# Patient Record
Sex: Male | Born: 1960 | Race: White | Hispanic: No | Marital: Single | Smoking: Former smoker
Health system: Southern US, Community
[De-identification: ages and names within clinical notes are randomized; demographics above are authoritative.]

## PROBLEM LIST (undated history)

## (undated) DIAGNOSIS — E785 Hyperlipidemia, unspecified: Secondary | ICD-10-CM

## (undated) DIAGNOSIS — Z87798 Personal history of other (corrected) congenital malformations: Secondary | ICD-10-CM

## (undated) DIAGNOSIS — B019 Varicella without complication: Secondary | ICD-10-CM

## (undated) DIAGNOSIS — D649 Anemia, unspecified: Secondary | ICD-10-CM

## (undated) DIAGNOSIS — D563 Thalassemia minor: Secondary | ICD-10-CM

## (undated) HISTORY — DX: Anemia, unspecified: D64.9

## (undated) HISTORY — DX: Varicella without complication: B01.9

## (undated) HISTORY — DX: Thalassemia minor: D56.3

## (undated) HISTORY — DX: Personal history of other (corrected) congenital malformations: Z87.798

## (undated) HISTORY — DX: Hyperlipidemia, unspecified: E78.5

---
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
## 2012-12-18 ENCOUNTER — Ambulatory Visit (INDEPENDENT_AMBULATORY_CARE_PROVIDER_SITE_OTHER): Payer: BC Managed Care – PPO | Admitting: Physician Assistant

## 2012-12-18 ENCOUNTER — Encounter: Payer: Self-pay | Admitting: Physician Assistant

## 2012-12-18 VITALS — BP 118/82 | HR 58 | Temp 98.5°F | Resp 16 | Ht 70.0 in | Wt 181.8 lb

## 2012-12-18 DIAGNOSIS — Z862 Personal history of diseases of the blood and blood-forming organs and certain disorders involving the immune mechanism: Secondary | ICD-10-CM

## 2012-12-18 DIAGNOSIS — Z8739 Personal history of other diseases of the musculoskeletal system and connective tissue: Secondary | ICD-10-CM | POA: Insufficient documentation

## 2012-12-18 DIAGNOSIS — R5383 Other fatigue: Secondary | ICD-10-CM | POA: Insufficient documentation

## 2012-12-18 DIAGNOSIS — D563 Thalassemia minor: Secondary | ICD-10-CM | POA: Insufficient documentation

## 2012-12-18 DIAGNOSIS — R5381 Other malaise: Secondary | ICD-10-CM

## 2012-12-18 DIAGNOSIS — Z0000 Encounter for general adult medical examination without abnormal findings: Secondary | ICD-10-CM

## 2012-12-18 LAB — HEMOGLOBIN A1C
Hgb A1c MFr Bld: 5.3 % (ref <5.7)
Mean Plasma Glucose: 105 mg/dL (ref <117)

## 2012-12-18 LAB — PSA: PSA: 0.83 ng/mL (ref <=4.00)

## 2012-12-18 LAB — HEPATIC FUNCTION PANEL
ALT: 29 U/L (ref 0–53)
AST: 23 U/L (ref 0–37)
Alkaline Phosphatase: 43 U/L (ref 39–117)
Bilirubin, Direct: 0.5 mg/dL — ABNORMAL HIGH (ref 0.0–0.3)
Indirect Bilirubin: 2.2 mg/dL — ABNORMAL HIGH (ref 0.0–0.9)
Total Protein: 7.4 g/dL (ref 6.0–8.3)

## 2012-12-18 LAB — CBC WITH DIFFERENTIAL/PLATELET
Eosinophils Relative: 1 % (ref 0–5)
Lymphocytes Relative: 33 % (ref 12–46)
Lymphs Abs: 1.3 10*3/uL (ref 0.7–4.0)
MCV: 56.7 fL — ABNORMAL LOW (ref 78.0–100.0)
Neutro Abs: 2.3 10*3/uL (ref 1.7–7.7)
Platelets: 172 10*3/uL (ref 150–400)
RBC: 6.53 MIL/uL — ABNORMAL HIGH (ref 4.22–5.81)
WBC: 4 10*3/uL (ref 4.0–10.5)

## 2012-12-18 LAB — LIPID PANEL
LDL Cholesterol: 145 mg/dL — ABNORMAL HIGH (ref 0–99)
VLDL: 13 mg/dL (ref 0–40)

## 2012-12-18 LAB — BASIC METABOLIC PANEL
CO2: 27 mEq/L (ref 19–32)
Chloride: 104 mEq/L (ref 96–112)
Creat: 1.02 mg/dL (ref 0.50–1.35)
Potassium: 4.4 mEq/L (ref 3.5–5.3)
Sodium: 139 mEq/L (ref 135–145)

## 2012-12-18 LAB — TSH: TSH: 0.842 u[IU]/mL (ref 0.350–4.500)

## 2012-12-18 NOTE — Assessment & Plan Note (Signed)
Will check labs today

## 2012-12-18 NOTE — Assessment & Plan Note (Signed)
Will check CBC, TSH, Testosterone.  Patient to attempt 7-8 hours of sleep each night.  Daily exercise as tolerated.  Improve current diet.

## 2012-12-18 NOTE — Assessment & Plan Note (Signed)
Patient encouraged to have annual eye examination, and dental checks 2 x year.  Will obtain fasting labs at today's visit.  Gets annual flu shot through work

## 2012-12-18 NOTE — Assessment & Plan Note (Signed)
Asymptomatic.  Continue monthly care with Chiropractor

## 2012-12-18 NOTE — Patient Instructions (Signed)
Please obtain fasting labs.  I will call you with the results.  We will treat you accordingly to lab results.  Return in 1 year for annual exam or sooner if needed.  Fatigue Fatigue is a feeling of tiredness, lack of energy, lack of motivation, or feeling tired all the time. Having enough rest, good nutrition, and reducing stress will normally reduce fatigue. Consult your caregiver if it persists. The nature of your fatigue will help your caregiver to find out its cause. The treatment is based on the cause.  CAUSES  There are many causes for fatigue. Most of the time, fatigue can be traced to one or more of your habits or routines. Most causes fit into one or more of three general areas. They are: Lifestyle problems  Sleep disturbances.  Overwork.  Physical exertion.  Unhealthy habits.  Poor eating habits or eating disorders.  Alcohol and/or drug use .  Lack of proper nutrition (malnutrition). Psychological problems  Stress and/or anxiety problems.  Depression.  Grief.  Boredom. Medical Problems or Conditions  Anemia.  Pregnancy.  Thyroid gland problems.  Recovery from major surgery.  Continuous pain.  Emphysema or asthma that is not well controlled  Allergic conditions.  Diabetes.  Infections (such as mononucleosis).  Obesity.  Sleep disorders, such as sleep apnea.  Heart failure or other heart-related problems.  Cancer.  Kidney disease.  Liver disease.  Effects of certain medicines such as antihistamines, cough and cold remedies, prescription pain medicines, heart and blood pressure medicines, drugs used for treatment of cancer, and some antidepressants. SYMPTOMS  The symptoms of fatigue include:   Lack of energy.  Lack of drive (motivation).  Drowsiness.  Feeling of indifference to the surroundings. DIAGNOSIS  The details of how you feel help guide your caregiver in finding out what is causing the fatigue. You will be asked about your  present and past health condition. It is important to review all medicines that you take, including prescription and non-prescription items. A thorough exam will be done. You will be questioned about your feelings, habits, and normal lifestyle. Your caregiver may suggest blood tests, urine tests, or other tests to look for common medical causes of fatigue.  TREATMENT  Fatigue is treated by correcting the underlying cause. For example, if you have continuous pain or depression, treating these causes will improve how you feel. Similarly, adjusting the dose of certain medicines will help in reducing fatigue.  HOME CARE INSTRUCTIONS   Try to get the required amount of good sleep every night.  Eat a healthy and nutritious diet, and drink enough water throughout the day.  Practice ways of relaxing (including yoga or meditation).  Exercise regularly.  Make plans to change situations that cause stress. Act on those plans so that stresses decrease over time. Keep your work and personal routine reasonable.  Avoid street drugs and minimize use of alcohol.  Start taking a daily multivitamin after consulting your caregiver. SEEK MEDICAL CARE IF:   You have persistent tiredness, which cannot be accounted for.  You have fever.  You have unintentional weight loss.  You have headaches.  You have disturbed sleep throughout the night.  You are feeling sad.  You have constipation.  You have dry skin.  You have gained weight.  You are taking any new or different medicines that you suspect are causing fatigue.  You are unable to sleep at night.  You develop any unusual swelling of your legs or other parts of your body. SEEK  IMMEDIATE MEDICAL CARE IF:   You are feeling confused.  Your vision is blurred.  You feel faint or pass out.  You develop severe headache.  You develop severe abdominal, pelvic, or back pain.  You develop chest pain, shortness of breath, or an irregular or fast  heartbeat.  You are unable to pass a normal amount of urine.  You develop abnormal bleeding such as bleeding from the rectum or you vomit blood.  You have thoughts about harming yourself or committing suicide.  You are worried that you might harm someone else. MAKE SURE YOU:   Understand these instructions.  Will watch your condition.  Will get help right away if you are not doing well or get worse. Document Released: 02/04/2007 Document Revised: 07/02/2011 Document Reviewed: 02/04/2007 [facility] Patient Information 2014 [place], [place].

## 2012-12-18 NOTE — Progress Notes (Signed)
Patient ID: Howard Hicks, male   DOB: [birth year], 52 y.o.   MRN: [id number]  Patient presents to clinic today to establish care.  Information was obtained from the patient.   Acute Concerns: Patient presents c/o fatigue for the past 3 weeks.  Patient states he just does not have his usual energy.  Denies recent changes at work or home.  Denies history of anxiety or depression.  Endorses pound weight loss over the past year.  Denies history of low testosterone or thyroid disorder.  Has history of anemia but has never been on medication for this.  Denies HTN, HLD, SOB, recent infection.  Denies heart palpitations.  Denies C/D.  Denies heat/cold insensitivity.  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  Patient has rare back pain.  Sees chiropractor monthly.  Health Maintenance: Eyes -- last visit over 1 year ago. Dental -- yearly cleanings Immunizations -- up-to-date.  Gets immunizations through work. PSA -- yearly since 51 with no abnormal values.  Asymptomatic.  Past Medical History  Diagnosis Date  . Anemia   . Chicken pox    No current outpatient prescriptions on file prior to visit.   No current facility-administered medications on file prior to visit.    No Known Allergies  Family History  Problem Relation Age of Onset  . Hyperlipidemia Father   . Dementia Father   . Anemia Mother   . Dementia Paternal Grandmother   . Diabetes Maternal Grandmother   . Crohn's disease Paternal Uncle   . Anemia Brother   . Healthy Sister     x2    History   Social History  . Marital Status: Divorced    Spouse Name: N/A    Number of Children: N/A  . Years of Education: N/A   Social History Main Topics  . Smoking status: Current Some Day Smoker    Types: Cigars  . Smokeless tobacco: None     Comment: Cigars Only Socially.  . Alcohol Use: None  . Drug Use: None  . Sexual Activity: None   Other Topics Concern  . None   Social History Narrative  .  None   Review of Systems  Constitutional: Positive for malaise/fatigue. Negative for fever and chills.  HENT: Negative for hearing loss, ear pain, tinnitus and ear discharge.   Eyes: Negative for blurred vision, double vision, photophobia and pain.  Respiratory: Negative for cough, shortness of breath and wheezing.   Cardiovascular: Negative for chest pain and palpitations.  Gastrointestinal: Negative for heartburn, nausea, vomiting, abdominal pain, diarrhea, constipation, blood in stool and melena.  Genitourinary: Negative for dysuria, urgency, frequency, hematuria and flank pain.  Musculoskeletal: Positive for back pain. Negative for myalgias.  Skin: Negative for rash.  Neurological: Negative for dizziness, seizures, loss of consciousness and headaches.  Endo/Heme/Allergies: Negative for environmental allergies. Does not bruise/bleed easily.  Psychiatric/Behavioral: Negative for depression, suicidal ideas and substance abuse. The patient is not nervous/anxious and does not have insomnia.    Filed Vitals:   12/18/12 0835  BP: 118/82  Pulse: 58  Temp: 98.5 F (36.9 C)  Resp: 16   Physical Exam  Constitutional: He is oriented to person, place, and time and well-developed, well-nourished, and in no distress.  HENT:  Head: Normocephalic and atraumatic.  Right Ear: External ear normal.  Left Ear: External ear normal.  Nose: Nose normal.  Mouth/Throat: Oropharynx is clear and moist. No oropharyngeal exudate.  TM WNL Bilaterally  Eyes: Conjunctivae and EOM are  normal. Pupils are equal, round, and reactive to light.  Neck: Normal range of motion. Neck supple. No thyromegaly present.  Cardiovascular: Normal rate, regular rhythm, normal heart sounds and intact distal pulses.   Pulmonary/Chest: Effort normal and breath sounds normal.  Abdominal: Soft. Bowel sounds are normal. He exhibits no distension and no mass. There is no tenderness. There is no rebound and no guarding.   Musculoskeletal: Normal range of motion. He exhibits no tenderness.  Lymphadenopathy:    He has no cervical adenopathy.  Neurological: He is alert and oriented to person, place, and time. He has normal reflexes. No cranial nerve deficit.  Skin: Skin is warm and dry. No rash noted.   Assessment/Plan: No problem-specific assessment & plan notes found for this encounter.

## 2012-12-19 LAB — URINALYSIS, ROUTINE W REFLEX MICROSCOPIC
Bilirubin Urine: NEGATIVE
Hgb urine dipstick: NEGATIVE
Ketones, ur: NEGATIVE mg/dL
Nitrite: NEGATIVE
Urobilinogen, UA: 0.2 mg/dL (ref 0.0–1.0)

## 2012-12-30 ENCOUNTER — Telehealth: Payer: Self-pay | Admitting: Physician Assistant

## 2012-12-30 NOTE — Telephone Encounter (Signed)
Received medical records from [facility]

## 2012-12-31 ENCOUNTER — Telehealth: Payer: Self-pay | Admitting: *Deleted

## 2012-12-31 NOTE — Telephone Encounter (Signed)
Message    Please inform patient that his bilirubin is slightly elevated which could be due to his thalassemia (from red blood cell breakdown). I would like for him to increase his fluid intake and return in 2 weeks for a follow-up and repeat liver function testing.    Patient scheduled for f/u OV on Tuesday, 09.16.14 at 8:15a/SLS

## 2013-01-06 ENCOUNTER — Encounter: Payer: Self-pay | Admitting: Physician Assistant

## 2013-01-06 ENCOUNTER — Ambulatory Visit (INDEPENDENT_AMBULATORY_CARE_PROVIDER_SITE_OTHER): Payer: BC Managed Care – PPO | Admitting: Physician Assistant

## 2013-01-06 VITALS — BP 118/64 | HR 51 | Temp 97.6°F | Resp 10 | Wt 186.0 lb

## 2013-01-06 DIAGNOSIS — R17 Unspecified jaundice: Secondary | ICD-10-CM | POA: Insufficient documentation

## 2013-01-06 DIAGNOSIS — D563 Thalassemia minor: Secondary | ICD-10-CM

## 2013-01-06 DIAGNOSIS — R6889 Other general symptoms and signs: Secondary | ICD-10-CM

## 2013-01-06 DIAGNOSIS — R899 Unspecified abnormal finding in specimens from other organs, systems and tissues: Secondary | ICD-10-CM

## 2013-01-06 LAB — CBC WITH DIFFERENTIAL/PLATELET
Hemoglobin: 12 g/dL — ABNORMAL LOW (ref 13.0–17.0)
Lymphocytes Relative: 33 % (ref 12–46)
Lymphs Abs: 1.3 10*3/uL (ref 0.7–4.0)
MCH: 18.6 pg — ABNORMAL LOW (ref 26.0–34.0)
Monocytes Relative: 9 % (ref 3–12)
Neutrophils Relative %: 54 % (ref 43–77)
Platelets: 193 10*3/uL (ref 150–400)
RBC: 6.44 MIL/uL — ABNORMAL HIGH (ref 4.22–5.81)
WBC: 3.9 10*3/uL — ABNORMAL LOW (ref 4.0–10.5)

## 2013-01-06 LAB — COMPREHENSIVE METABOLIC PANEL
Albumin: 4.3 g/dL (ref 3.5–5.2)
Alkaline Phosphatase: 40 U/L (ref 39–117)
BUN: 12 mg/dL (ref 6–23)
CO2: 28 mEq/L (ref 19–32)
Glucose, Bld: 90 mg/dL (ref 70–99)
Sodium: 141 mEq/L (ref 135–145)
Total Bilirubin: 1.8 mg/dL — ABNORMAL HIGH (ref 0.3–1.2)
Total Protein: 6.9 g/dL (ref 6.0–8.3)

## 2013-01-06 NOTE — Progress Notes (Signed)
Patient ID: Howard Hicks, male   DOB: Jun 14, 1960, 52 y.o.   MRN: [id number]  Patient presents to clinic today for 2 week follow-up of elevated bilirubin.  Patient has history of thalassemia for which he has seen hematologist in the past.  Patient states his anemia required no treatment according to the hematologist.  Was able to track down records this morning to verify.  Patient has diagnosis of alpha thalassemia trait.  Patient states that since last visit he has felt much better and is back to normal in terms of energy levels.  Patient has no other complaints today.  Past Medical History  Diagnosis Date  . Anemia   . Chicken pox     No current outpatient prescriptions on file prior to visit.   No current facility-administered medications on file prior to visit.    No Known Allergies  Family History  Problem Relation Age of Onset  . Hyperlipidemia Father   . Dementia Father   . Anemia Mother   . Dementia Paternal Grandmother   . Diabetes Maternal Grandmother   . Crohn's disease Paternal Uncle   . Anemia Brother   . Healthy Sister     x2    History   Social History  . Marital Status: Divorced    Spouse Name: N/A    Number of Children: N/A  . Years of Education: N/A   Social History Main Topics  . Smoking status: Former Smoker    Types: Cigars  . Smokeless tobacco: Never Used     Comment: Cigars Only Socially.  . Alcohol Use: Yes     Comment: rare  . Drug Use: No  . Sexual Activity: Yes    Birth Control/ Protection: None   Other Topics Concern  . None   Social History Narrative  . None   Review of Systems  Constitutional: Negative for fever, chills and malaise/fatigue.  Respiratory: Negative for shortness of breath.   Cardiovascular: Negative for chest pain and palpitations.  Neurological: Negative for headaches.   Filed Vitals:   01/06/13 0814  BP: 118/64  Pulse: 51  Temp: 97.6 F (36.4 C)  Resp: 10   Physical Exam  Vitals  reviewed. Constitutional: He is oriented to person, place, and time and well-developed, well-nourished, and in no distress.  HENT:  Head: Normocephalic and atraumatic.  Mouth/Throat: Oropharynx is clear and moist.  Eyes: Conjunctivae and EOM are normal.  Cardiovascular: Normal rate, regular rhythm, normal heart sounds and intact distal pulses.   Good capillary refill  Pulmonary/Chest: Effort normal and breath sounds normal.  Neurological: He is alert and oriented to person, place, and time.  Skin: Skin is warm and dry.   Recent Results (from the past 2160 hour(s))  CBC WITH DIFFERENTIAL     Status: Abnormal   Collection Time    12/18/12  9:49 AM      Result Value Range   WBC 4.0  4.0 - 10.5 K/uL   RBC 6.53 (*) 4.22 - 5.81 MIL/uL   Hemoglobin 12.5 (*) 13.0 - 17.0 g/dL   HCT 40.9 (*) 81.1 - 91.4 %   MCV 56.7 (*) 78.0 - 100.0 fL   MCH 19.1 (*) 26.0 - 34.0 pg   MCHC 33.8  30.0 - 36.0 g/dL   RDW 78.2 (*) 95.6 - 21.3 %   Platelets 172  150 - 400 K/uL   Neutrophils Relative % 58  43 - 77 %   Neutro Abs 2.3  1.7 - 7.7 K/uL  Lymphocytes Relative 33  12 - 46 %   Lymphs Abs 1.3  0.7 - 4.0 K/uL   Monocytes Relative 7  3 - 12 %   Monocytes Absolute 0.3  0.1 - 1.0 K/uL   Eosinophils Relative 1  0 - 5 %   Eosinophils Absolute 0.0  0.0 - 0.7 K/uL   Basophils Relative 1  0 - 1 %   Basophils Absolute 0.0  0.0 - 0.1 K/uL   Smear Review Criteria for review not met    BASIC METABOLIC PANEL     Status: None   Collection Time    12/18/12  9:49 AM      Result Value Range   Sodium 139  135 - 145 mEq/L   Potassium 4.4  3.5 - 5.3 mEq/L   Chloride 104  96 - 112 mEq/L   CO2 27  19 - 32 mEq/L   Glucose, Bld 90  70 - 99 mg/dL   BUN 13  6 - 23 mg/dL   Creat 2.95  2.84 - 1.32 mg/dL   Calcium 9.8  8.4 - 44.0 mg/dL  HEPATIC FUNCTION PANEL     Status: Abnormal   Collection Time    12/18/12  9:49 AM      Result Value Range   Total Bilirubin 2.7 (*) 0.3 - 1.2 mg/dL   Bilirubin, Direct 0.5 (*) 0.0 -  0.3 mg/dL   Indirect Bilirubin 2.2 (*) 0.0 - 0.9 mg/dL   Alkaline Phosphatase 43  39 - 117 U/L   AST 23  0 - 37 U/L   ALT 29  0 - 53 U/L   Total Protein 7.4  6.0 - 8.3 g/dL   Albumin 4.6  3.5 - 5.2 g/dL  LIPID PANEL     Status: Abnormal   Collection Time    12/18/12  9:49 AM      Result Value Range   Cholesterol 210 (*) 0 - 200 mg/dL   Comment: ATP III Classification:           < 200        mg/dL        Desirable          200 - 239     mg/dL        Borderline High          >= 240        mg/dL        High         Triglycerides 67  <150 mg/dL   HDL 52  >10 mg/dL   Total CHOL/HDL Ratio 4.0     VLDL 13  0 - 40 mg/dL   LDL Cholesterol 272 (*) 0 - 99 mg/dL   Comment:       Total Cholesterol/HDL Ratio:CHD Risk                            Coronary Heart Disease Risk Table                                            Men       Women              1/2 Average Risk              3.4  3.3                  Average Risk              5.0        4.4               2X Average Risk              9.6        7.1               3X Average Risk             23.4       11.0     Use the calculated Patient Ratio above and the CHD Risk table      to determine the patient's CHD Risk.     ATP III Classification (LDL):           < 100        mg/dL         Optimal          100 - 129     mg/dL         Near or Above Optimal          130 - 159     mg/dL         Borderline High          160 - 189     mg/dL         High           > 190        mg/dL         Very High        TSH     Status: None   Collection Time    12/18/12  9:49 AM      Result Value Range   TSH 0.842  0.350 - 4.500 uIU/mL  PSA     Status: None   Collection Time    12/18/12  9:49 AM      Result Value Range   PSA 0.83  <=4.00 ng/mL   Comment: Test Methodology: ECLIA PSA (Electrochemiluminescence Immunoassay)           For PSA values from 2.5-4.0, particularly in younger men <60 years     old, the AUA and NCCN suggest testing for % Free  PSA (3515) and     evaluation of the rate of increase in PSA (PSA velocity).  HEMOGLOBIN A1C     Status: None   Collection Time    12/18/12  9:49 AM      Result Value Range   Hemoglobin A1C 5.3  <5.7 %   Comment:                                                                            According to the ADA Clinical Practice Recommendations for 2011, when     HbA1c is used as a screening test:             >=6.5%   Diagnostic of Diabetes Mellitus                (  if abnormal result is confirmed)           5.7-6.4%   Increased risk of developing Diabetes Mellitus           References:Diagnosis and Classification of Diabetes Mellitus,Diabetes     Care,2011,34(Suppl 1):S62-S69 and Standards of Medical Care in             Diabetes - 2011,Diabetes Care,2011,34 (Suppl 1):S11-S61.         Mean Plasma Glucose 105  <117 mg/dL  URINALYSIS, ROUTINE W REFLEX MICROSCOPIC     Status: None   Collection Time    12/18/12  9:49 AM      Result Value Range   Color, Urine YELLOW  YELLOW   APPearance CLEAR  CLEAR   Specific Gravity, Urine 1.019  1.005 - 1.030   pH 6.0  5.0 - 8.0   Glucose, UA NEG  NEG mg/dL   Bilirubin Urine NEG  NEG   Ketones, ur NEG  NEG mg/dL   Hgb urine dipstick NEG  NEG   Protein, ur NEG  NEG mg/dL   Urobilinogen, UA 0.2  0.0 - 1.0 mg/dL   Nitrite NEG  NEG   Leukocytes, UA NEG  NEG  TESTOSTERONE     Status: None   Collection Time    12/18/12  9:49 AM      Result Value Range   Testosterone 698  300 - 890 ng/dL   Comment:           Tanner Stage       Male              Male                   I              < 30 ng/dL        < 10 ng/dL                   II             < 150 ng/dL       < 30 ng/dL                   III            100-320 ng/dL     < 35 ng/dL                   IV             200-970 ng/dL     16-10 ng/dL                   V/Adult        300-890 ng/dL     96-04 ng/dL          Assessment/Plan: Alpha thalassemia trait Repeat CMET. Records reviewed with  patient.  Patient asymptomatic.  Elevated bilirubin Repeat CMET.  Patient's record reviewed, showing transient elevations of bilirubin in the past coinciding with his thalassemia.

## 2013-01-06 NOTE — Patient Instructions (Addendum)
Please obtain labs today.  I will call you with your results.  Follow-up in 6 months for repeat cholesterol check.  Please call or return sooner if you need anything.

## 2013-01-06 NOTE — Assessment & Plan Note (Signed)
Repeat CMET.  Patient's record reviewed, showing transient elevations of bilirubin in the past coinciding with his thalassemia.

## 2013-01-06 NOTE — Assessment & Plan Note (Addendum)
Repeat CMET. Records reviewed with patient.  Patient asymptomatic.

## 2013-06-03 ENCOUNTER — Encounter: Payer: Self-pay | Admitting: *Deleted

## 2013-06-29 ENCOUNTER — Encounter: Payer: Self-pay | Admitting: Physician Assistant

## 2013-06-29 ENCOUNTER — Ambulatory Visit (INDEPENDENT_AMBULATORY_CARE_PROVIDER_SITE_OTHER): Payer: BC Managed Care – PPO | Admitting: Physician Assistant

## 2013-06-29 ENCOUNTER — Ambulatory Visit (HOSPITAL_BASED_OUTPATIENT_CLINIC_OR_DEPARTMENT_OTHER)
Admission: RE | Admit: 2013-06-29 | Discharge: 2013-06-29 | Disposition: A | Discharge status: Home / Self Care | Payer: BC Managed Care – PPO | Source: Ambulatory Visit | Attending: Physician Assistant | Admitting: Physician Assistant

## 2013-06-29 VITALS — BP 136/83 | HR 66 | Temp 98.2°F | Resp 16 | Ht 70.0 in | Wt 181.4 lb

## 2013-06-29 DIAGNOSIS — R1013 Epigastric pain: Secondary | ICD-10-CM | POA: Insufficient documentation

## 2013-06-29 LAB — COMPREHENSIVE METABOLIC PANEL
ALT: 35 U/L (ref 0–53)
AST: 25 U/L (ref 0–37)
Albumin: 4 g/dL (ref 3.5–5.2)
Alkaline Phosphatase: 52 U/L (ref 39–117)
BUN: 13 mg/dL (ref 6–23)
CALCIUM: 9.1 mg/dL (ref 8.4–10.5)
CHLORIDE: 106 meq/L (ref 96–112)
CO2: 29 mEq/L (ref 19–32)
CREATININE: 0.9 mg/dL (ref 0.4–1.5)
GFR: 88.95 mL/min (ref >60.00)
GLUCOSE: 95 mg/dL (ref 70–99)
Potassium: 3.7 mEq/L (ref 3.5–5.1)
Sodium: 141 mEq/L (ref 135–145)
Total Bilirubin: 2 mg/dL — ABNORMAL HIGH (ref 0.3–1.2)
Total Protein: 6.9 g/dL (ref 6.0–8.3)

## 2013-06-29 LAB — CBC WITH DIFFERENTIAL/PLATELET
Basophils Absolute: 0 10*3/uL (ref 0.0–0.1)
Basophils Relative: 0.4 % (ref 0.0–3.0)
EOS ABS: 1.5 10*3/uL — AB (ref 0.0–0.7)
Eosinophils Relative: 18.3 % — ABNORMAL HIGH (ref 0.0–5.0)
HCT: 38.9 % — ABNORMAL LOW (ref 39.0–52.0)
Hemoglobin: 12 g/dL — ABNORMAL LOW (ref 13.0–17.0)
LYMPHS PCT: 21.4 % (ref 12.0–46.0)
Lymphs Abs: 1.7 10*3/uL (ref 0.7–4.0)
MCHC: 30.9 g/dL (ref 30.0–36.0)
MCV: 61.4 fl — ABNORMAL LOW (ref 78.0–100.0)
Monocytes Absolute: 0.4 10*3/uL (ref 0.1–1.0)
Monocytes Relative: 5.3 % (ref 3.0–12.0)
NEUTROS ABS: 4.4 10*3/uL (ref 1.4–7.7)
Neutrophils Relative %: 54.6 % (ref 43.0–77.0)
Platelets: 171 10*3/uL (ref 150.0–400.0)
RBC: 6.33 Mil/uL — ABNORMAL HIGH (ref 4.22–5.81)
RDW: 18.7 % — ABNORMAL HIGH (ref 11.5–14.6)
WBC: 8 10*3/uL (ref 4.5–10.5)

## 2013-06-29 LAB — H. PYLORI ANTIBODY, IGG: H Pylori IgG: NEGATIVE

## 2013-06-29 MED ORDER — TRAMADOL HCL 50 MG PO TABS: 50.0000 mg | ORAL_TABLET | Freq: Three times a day (TID) | ORAL | Status: DC | PRN

## 2013-06-29 MED ORDER — DEXLANSOPRAZOLE 60 MG PO CPDR: 60.0000 mg | DELAYED_RELEASE_CAPSULE | Freq: Every day | ORAL | Status: DC

## 2013-06-29 NOTE — Assessment & Plan Note (Signed)
DDx: PUD, H. Pylori infection, Mild pancreatitis. CBC, CMP, H. Pylori IgG, Abdominal US.  Rx Tramadol for severe pain.  Samples of Dexilant given to patient to take daily.  Use Pepto Bismol as needed.  Elevate HOB.  If workup unremarkable, will need referral to GI.  Patient instructed to proceed directly to the ER if symptoms acutely worsen. Patient voices understanding.

## 2013-06-29 NOTE — Patient Instructions (Addendum)
Please obtain labs.  I will call you with your results.  I will also call you with your imaging results.  Please avoid late-night eating and spicy foods.  Please take Dexilant daily.  Use Pepto-Bismol as needed.  Tylenol for pain. Tramadol for severe pain. Avoid Ibuprofen, Aleve, BC powders.  No alcohol consumption.  If symptoms acutely worsen before workup can be completed, please proceed to the ER.

## 2013-06-29 NOTE — Progress Notes (Signed)
Patient presents to clinic today c/o constant dull epigastric pain x 1 week. Pain is worse after eating.  Patient endorses intermittent sharp pains.  Patient endorses some mild nausea and increased belching.  Patient denies fever, chills, emesis.  Denies change in bowel habits, melena or hematochezia.  Denies recent alcohol consumption, NSAID use.  Denies hx of gallstone.  Vital signs reviewed and found to be within normal limits.  Past Medical History  Diagnosis Date  . Anemia   . Chicken pox   . Alpha thalassemia trait   . Hyperlipidemia   . H/O removal of thyroglossal duct cyst     No current outpatient prescriptions on file prior to visit.   No current facility-administered medications on file prior to visit.    No Known Allergies  Family History  Problem Relation Age of Onset  . Hyperlipidemia Father   . Dementia Father   . Anemia Mother   . Dementia Paternal Grandmother   . Diabetes Maternal Grandmother   . Crohn's disease Paternal Uncle   . Anemia Brother   . Healthy Sister     x2  . Colon cancer Neg Hx   . Osteoporosis Mother   . Thyroid disease Father   . Colon polyps Father     History   Social History  . Marital Status: Divorced    Spouse Name: N/A    Number of Children: N/A  . Years of Education: N/A   Social History Main Topics  . Smoking status: Former Smoker    Types: Cigars  . Smokeless tobacco: Never Used     Comment: Cigars Only Socially.  . Alcohol Use: Yes     Comment: rare  . Drug Use: No  . Sexual Activity: Yes    Birth Control/ Protection: None   Other Topics Concern  . None   Social History Narrative   Pt Divorced, 2 children, 1 grandchild   Occupation: Museum/gallery conservatorvideographer & cameraman   Employer: [employer]   Hobbies: rock climbing         Review of Systems - See HPI.  All other ROS are negative.  BP 136/83  Pulse 66  Temp(Src) 98.2 F (36.8 C) (Oral)  Resp 16  Ht 5\' 10"  (1.778 m)  Wt 181 lb 6 oz (82.271 kg)  BMI 26.02 kg/m2   SpO2 100%  Physical Exam  Vitals reviewed. Constitutional: He is oriented to person, place, and time and well-developed, well-nourished, and in no distress.  HENT:  Head: Normocephalic and atraumatic.  Eyes: Conjunctivae are normal. Pupils are equal, round, and reactive to light.  Neck: Neck supple.  Cardiovascular: Normal rate, regular rhythm, normal heart sounds and intact distal pulses.   Pulmonary/Chest: Effort normal and breath sounds normal.  Abdominal: Soft. Bowel sounds are normal. He exhibits no distension and no mass. There is no hepatosplenomegaly. There is tenderness in the right upper quadrant and epigastric area. There is no rebound, no guarding and negative Murphy's sign.  Neurological: He is alert and oriented to person, place, and time.  Skin: Skin is warm and dry. No rash noted.  Psychiatric: Affect normal.   Assessment/Plan: Abdominal pain, epigastric DDx: PUD, H. Pylori infection, Mild pancreatitis. CBC, CMP, H. Pylori IgG, Abdominal US.  Rx Tramadol for severe pain.  Samples of Dexilant given to patient to take daily.  Use Pepto Bismol as needed.  Elevate HOB.  If workup unremarkable, will need referral to GI.  Patient instructed to proceed directly to the ER if  symptoms acutely worsen. Patient voices understanding.

## 2013-06-29 NOTE — Progress Notes (Signed)
Pre visit review using our clinic review tool, if applicable. No additional management support is needed unless otherwise documented below in the visit note/SLS  

## 2013-07-17 ENCOUNTER — Ambulatory Visit: Payer: BC Managed Care – PPO | Admitting: Physician Assistant

## 2013-08-07 ENCOUNTER — Encounter: Payer: Self-pay | Admitting: Physician Assistant

## 2014-06-15 ENCOUNTER — Encounter: Payer: Self-pay | Admitting: *Deleted

## 2014-06-15 ENCOUNTER — Telehealth: Payer: Self-pay | Admitting: *Deleted

## 2014-06-15 NOTE — Telephone Encounter (Signed)
Pre-Visit Call:   Reviewed allergies, medications, health history, and health maintenance with patient and made changes as appropriate.    Patient states that he takes no medications (Left on Medication list, marked Not Taking) Preferred pharmacy:  [id number] - [place],  - [address]   Health Maintenance:  PSA- 12/18/12 with Malva Coganody Martin, PA- normal CCS- 12/18/12 with Danny LawlessJohn Baillie, MD at [facility]- normal HIV screening- unknown, patient states "it's been a while"  Immunizations: UTD Flu-02/04/14 Td- 05/09/07  Concerns: Patient has history of Thalassemia, states he periodically feels fatigued and this episode of fatigue is "lingering"

## 2014-06-16 ENCOUNTER — Encounter: Payer: Self-pay | Admitting: Physician Assistant

## 2014-06-16 ENCOUNTER — Ambulatory Visit (INDEPENDENT_AMBULATORY_CARE_PROVIDER_SITE_OTHER): Payer: BLUE CROSS/BLUE SHIELD | Admitting: Physician Assistant

## 2014-06-16 VITALS — BP 116/80 | HR 53 | Temp 97.9°F | Resp 16 | Ht 70.0 in | Wt 187.4 lb

## 2014-06-16 DIAGNOSIS — Z0000 Encounter for general adult medical examination without abnormal findings: Secondary | ICD-10-CM

## 2014-06-16 DIAGNOSIS — R5382 Chronic fatigue, unspecified: Secondary | ICD-10-CM

## 2014-06-16 LAB — CBC
HCT: 38.4 % — ABNORMAL LOW (ref 39.0–52.0)
Hemoglobin: 12.2 g/dL — ABNORMAL LOW (ref 13.0–17.0)
MCHC: 31.7 g/dL (ref 30.0–36.0)
MCV: 60.4 fl — ABNORMAL LOW (ref 78.0–100.0)
Platelets: 249 10*3/uL (ref 150.0–400.0)
RBC: 6.36 Mil/uL — ABNORMAL HIGH (ref 4.22–5.81)
RDW: 17.8 % — AB (ref 11.5–15.5)
WBC: 3.7 10*3/uL — ABNORMAL LOW (ref 4.0–10.5)

## 2014-06-16 LAB — URINALYSIS, ROUTINE W REFLEX MICROSCOPIC
Bilirubin Urine: NEGATIVE
HGB URINE DIPSTICK: NEGATIVE
Ketones, ur: NEGATIVE
Leukocytes, UA: NEGATIVE
Nitrite: NEGATIVE
RBC / HPF: NONE SEEN (ref 0–?)
Specific Gravity, Urine: <=1.005 — AB (ref 1.000–1.030)
Total Protein, Urine: NEGATIVE
URINE GLUCOSE: NEGATIVE
Urobilinogen, UA: 0.2 (ref 0.0–1.0)
WBC, UA: NONE SEEN (ref 0–?)
pH: 6 (ref 5.0–8.0)

## 2014-06-16 LAB — LIPID PANEL
CHOLESTEROL: 192 mg/dL (ref 0–200)
HDL: 49.2 mg/dL (ref >39.00)
LDL Cholesterol: 130 mg/dL — ABNORMAL HIGH (ref 0–99)
NONHDL: 142.8
Total CHOL/HDL Ratio: 4
Triglycerides: 63 mg/dL (ref 0.0–149.0)
VLDL: 12.6 mg/dL (ref 0.0–40.0)

## 2014-06-16 LAB — BASIC METABOLIC PANEL
BUN: 16 mg/dL (ref 6–23)
CO2: 30 meq/L (ref 19–32)
CREATININE: 0.92 mg/dL (ref 0.40–1.50)
Calcium: 9.5 mg/dL (ref 8.4–10.5)
Chloride: 106 mEq/L (ref 96–112)
GFR: 90.85 mL/min (ref >60.00)
Glucose, Bld: 91 mg/dL (ref 70–99)
Potassium: 4.1 mEq/L (ref 3.5–5.1)
Sodium: 139 mEq/L (ref 135–145)

## 2014-06-16 LAB — HEPATIC FUNCTION PANEL
ALK PHOS: 48 U/L (ref 39–117)
ALT: 24 U/L (ref 0–53)
AST: 19 U/L (ref 0–37)
Albumin: 4.3 g/dL (ref 3.5–5.2)
Bilirubin, Direct: 0.3 mg/dL (ref 0.0–0.3)
TOTAL PROTEIN: 7.1 g/dL (ref 6.0–8.3)
Total Bilirubin: 1.6 mg/dL — ABNORMAL HIGH (ref 0.2–1.2)

## 2014-06-16 LAB — HIGH SENSITIVITY CRP: CRP HIGH SENSITIVITY: 0.94 mg/L (ref 0.000–5.000)

## 2014-06-16 LAB — HEMOGLOBIN A1C: HEMOGLOBIN A1C: 5.5 % (ref 4.6–6.5)

## 2014-06-16 LAB — TSH: TSH: 0.7 u[IU]/mL (ref 0.35–4.50)

## 2014-06-16 NOTE — Assessment & Plan Note (Signed)
I have reviewed the patient's medical history in detail and updated the computerized patient record.  PHQ-2 Depression screen performed with score of 0.  Health Maintenance up-to-date. Will obtain fasting labs today.  Preventive care discussed.  Handout given.

## 2014-06-16 NOTE — Patient Instructions (Signed)
Please stop by the lab for blood work.  I will call you with your results. Stay active and eat a well-balanced diet.  We are looking into the fatigue.  If your labs reveal an abnormality, a further workup may be needed to assess source of fatigue.  Preventive Care for Adults A healthy lifestyle and preventive care can promote health and wellness. Preventive health guidelines for men include the following key practices:  A routine yearly physical is a good way to check with your health care provider about your health and preventative screening. It is a chance to share any concerns and updates on your health and to receive a thorough exam.  Visit your dentist for a routine exam and preventative care every 6 months. Brush your teeth twice a day and floss once a day. Good oral hygiene prevents tooth decay and gum disease.  The frequency of eye exams is based on your age, health, family medical history, use of contact lenses, and other factors. Follow your health care provider's recommendations for frequency of eye exams.  Eat a healthy diet. Foods such as vegetables, fruits, whole grains, low-fat dairy products, and lean protein foods contain the nutrients you need without too many calories. Decrease your intake of foods high in solid fats, added sugars, and salt. Eat the right amount of calories for you.Get information about a proper diet from your health care provider, if necessary.  Regular physical exercise is one of the most important things you can do for your health. Most adults should get at least 150 minutes of moderate-intensity exercise (any activity that increases your heart rate and causes you to sweat) each week. In addition, most adults need muscle-strengthening exercises on 2 or more days a week.  Maintain a healthy weight. The body mass index (BMI) is a screening tool to identify possible weight problems. It provides an estimate of body fat based on height and weight. Your health care  provider can find your BMI and can help you achieve or maintain a healthy weight.For adults 20 years and older:  A BMI below 18.5 is considered underweight.  A BMI of 18.5 to 24.9 is normal.  A BMI of 25 to 29.9 is considered overweight.  A BMI of 30 and above is considered obese.  Maintain normal blood lipids and cholesterol levels by exercising and minimizing your intake of saturated fat. Eat a balanced diet with plenty of fruit and vegetables. Blood tests for lipids and cholesterol should begin at age 37 and be repeated every 5 years. If your lipid or cholesterol levels are high, you are over 50, or you are at high risk for heart disease, you may need your cholesterol levels checked more frequently.Ongoing high lipid and cholesterol levels should be treated with medicines if diet and exercise are not working.  If you smoke, find out from your health care provider how to quit. If you do not use tobacco, do not start.  Lung cancer screening is recommended for adults aged 44-80 years who are at high risk for developing lung cancer because of a history of smoking. A yearly low-dose CT scan of the lungs is recommended for people who have at least a 30-pack-year history of smoking and are a current smoker or have quit within the past 15 years. A pack year of smoking is smoking an average of 1 pack of cigarettes a day for 1 year (for example: 1 pack a day for 30 years or 2 packs a day for 15  years). Yearly screening should continue until the smoker has stopped smoking for at least 15 years. Yearly screening should be stopped for people who develop a health problem that would prevent them from having lung cancer treatment.  If you choose to drink alcohol, do not have more than 2 drinks per day. One drink is considered to be 12 ounces (355 mL) of beer, 5 ounces (148 mL) of wine, or 1.5 ounces (44 mL) of liquor.  Avoid use of street drugs. Do not share needles with anyone. Ask for help if you need  support or instructions about stopping the use of drugs.  High blood pressure causes heart disease and increases the risk of stroke. Your blood pressure should be checked at least every 1-2 years. Ongoing high blood pressure should be treated with medicines, if weight loss and exercise are not effective.  If you are 14-109 years old, ask your health care provider if you should take aspirin to prevent heart disease.  Diabetes screening involves taking a blood sample to check your fasting blood sugar level. This should be done once every 3 years, after age 66, if you are within normal weight and without risk factors for diabetes. Testing should be considered at a younger age or be carried out more frequently if you are overweight and have at least 1 risk factor for diabetes.  Colorectal cancer can be detected and often prevented. Most routine colorectal cancer screening begins at the age of 44 and continues through age 73. However, your health care provider may recommend screening at an earlier age if you have risk factors for colon cancer. On a yearly basis, your health care provider may provide home test kits to check for hidden blood in the stool. Use of a small camera at the end of a tube to directly examine the colon (sigmoidoscopy or colonoscopy) can detect the earliest forms of colorectal cancer. Talk to your health care provider about this at age 22, when routine screening begins. Direct exam of the colon should be repeated every 5-10 years through age 77, unless early forms of precancerous polyps or small growths are found.  People who are at an increased risk for hepatitis B should be screened for this virus. You are considered at high risk for hepatitis B if:  You were born in a country where hepatitis B occurs often. Talk with your health care provider about which countries are considered high risk.  Your parents were born in a high-risk country and you have not received a shot to protect  against hepatitis B (hepatitis B vaccine).  You have HIV or AIDS.  You use needles to inject street drugs.  You live with, or have sex with, someone who has hepatitis B.  You are a man who has sex with other men (MSM).  You get hemodialysis treatment.  You take certain medicines for conditions such as cancer, organ transplantation, and autoimmune conditions.  Hepatitis C blood testing is recommended for all people born from [birth year] through 1965 and any individual with known risks for hepatitis C.  Practice safe sex. Use condoms and avoid high-risk sexual practices to reduce the spread of sexually transmitted infections (STIs). STIs include gonorrhea, chlamydia, syphilis, trichomonas, herpes, HPV, and human immunodeficiency virus (HIV). Herpes, HIV, and HPV are viral illnesses that have no cure. They can result in disability, cancer, and death.  If you are at risk of being infected with HIV, it is recommended that you take a prescription medicine daily to  prevent HIV infection. This is called preexposure prophylaxis (PrEP). You are considered at risk if:  You are a man who has sex with other men (MSM) and have other risk factors.  You are a heterosexual man, are sexually active, and are at increased risk for HIV infection.  You take drugs by injection.  You are sexually active with a partner who has HIV.  Talk with your health care provider about whether you are at high risk of being infected with HIV. If you choose to begin PrEP, you should first be tested for HIV. You should then be tested every 3 months for as long as you are taking PrEP.  A one-time screening for abdominal aortic aneurysm (AAA) and surgical repair of large AAAs by ultrasound are recommended for men ages 26 to 68 years who are current or former smokers.  Healthy men should no longer receive prostate-specific antigen (PSA) blood tests as part of routine cancer screening. Talk with your health care provider about  prostate cancer screening.  Testicular cancer screening is not recommended for adult males who have no symptoms. Screening includes self-exam, a health care provider exam, and other screening tests. Consult with your health care provider about any symptoms you have or any concerns you have about testicular cancer.  Use sunscreen. Apply sunscreen liberally and repeatedly throughout the day. You should seek shade when your shadow is shorter than you. Protect yourself by wearing long sleeves, pants, a wide-brimmed hat, and sunglasses year round, whenever you are outdoors.  Once a month, do a whole-body skin exam, using a mirror to look at the skin on your back. Tell your health care provider about new moles, moles that have irregular borders, moles that are larger than a pencil eraser, or moles that have changed in shape or color.  Stay current with required vaccines (immunizations).  Influenza vaccine. All adults should be immunized every year.  Tetanus, diphtheria, and acellular pertussis (Td, Tdap) vaccine. An adult who has not previously received Tdap or who does not know his vaccine status should receive 1 dose of Tdap. This initial dose should be followed by tetanus and diphtheria toxoids (Td) booster doses every 10 years. Adults with an unknown or incomplete history of completing a 3-dose immunization series with Td-containing vaccines should begin or complete a primary immunization series including a Tdap dose. Adults should receive a Td booster every 10 years.  Varicella vaccine. An adult without evidence of immunity to varicella should receive 2 doses or a second dose if he has previously received 1 dose.  Human papillomavirus (HPV) vaccine. Males aged 63-21 years who have not received the vaccine previously should receive the 3-dose series. Males aged 22-26 years may be immunized. Immunization is recommended through the age of 9 years for any male who has sex with males and did not get any  or all doses earlier. Immunization is recommended for any person with an immunocompromised condition through the age of 54 years if he did not get any or all doses earlier. During the 3-dose series, the second dose should be obtained 4-8 weeks after the first dose. The third dose should be obtained 24 weeks after the first dose and 16 weeks after the second dose.  Zoster vaccine. One dose is recommended for adults aged 45 years or older unless certain conditions are present.  Measles, mumps, and rubella (MMR) vaccine. Adults born before [birth year] generally are considered immune to measles and mumps. Adults born in [birth year] or later should have  1 or more doses of MMR vaccine unless there is a contraindication to the vaccine or there is laboratory evidence of immunity to each of the three diseases. A routine second dose of MMR vaccine should be obtained at least 28 days after the first dose for students attending postsecondary schools, health care workers, or international travelers. People who received inactivated measles vaccine or an unknown type of measles vaccine during 1963-1967 should receive 2 doses of MMR vaccine. People who received inactivated mumps vaccine or an unknown type of mumps vaccine before 1979 and are at high risk for mumps infection should consider immunization with 2 doses of MMR vaccine. Unvaccinated health care workers born before [birth year] who lack laboratory evidence of measles, mumps, or rubella immunity or laboratory confirmation of disease should consider measles and mumps immunization with 2 doses of MMR vaccine or rubella immunization with 1 dose of MMR vaccine.  Pneumococcal 13-valent conjugate (PCV13) vaccine. When indicated, a person who is uncertain of his immunization history and has no record of immunization should receive the PCV13 vaccine. An adult aged 26 years or older who has certain medical conditions and has not been previously immunized should receive 1 dose of PCV13 vaccine.  This PCV13 should be followed with a dose of pneumococcal polysaccharide (PPSV23) vaccine. The PPSV23 vaccine dose should be obtained at least 8 weeks after the dose of PCV13 vaccine. An adult aged 64 years or older who has certain medical conditions and previously received 1 or more doses of PPSV23 vaccine should receive 1 dose of PCV13. The PCV13 vaccine dose should be obtained 1 or more years after the last PPSV23 vaccine dose.  Pneumococcal polysaccharide (PPSV23) vaccine. When PCV13 is also indicated, PCV13 should be obtained first. All adults aged 36 years and older should be immunized. An adult younger than age 32 years who has certain medical conditions should be immunized. Any person who resides in a nursing home or long-term care facility should be immunized. An adult smoker should be immunized. People with an immunocompromised condition and certain other conditions should receive both PCV13 and PPSV23 vaccines. People with human immunodeficiency virus (HIV) infection should be immunized as soon as possible after diagnosis. Immunization during chemotherapy or radiation therapy should be avoided. Routine use of PPSV23 vaccine is not recommended for American Indians, [place] Natives, or people younger than 65 years unless there are medical conditions that require PPSV23 vaccine. When indicated, people who have unknown immunization and have no record of immunization should receive PPSV23 vaccine. One-time revaccination 5 years after the first dose of PPSV23 is recommended for people aged 19-64 years who have chronic kidney failure, nephrotic syndrome, asplenia, or immunocompromised conditions. People who received 1-2 doses of PPSV23 before age 18 years should receive another dose of PPSV23 vaccine at age 26 years or later if at least 5 years have passed since the previous dose. Doses of PPSV23 are not needed for people immunized with PPSV23 at or after age 80 years.  Meningococcal vaccine. Adults with  asplenia or persistent complement component deficiencies should receive 2 doses of quadrivalent meningococcal conjugate (MenACWY-D) vaccine. The doses should be obtained at least 2 months apart. Microbiologists working with certain meningococcal bacteria, [place] recruits, people at risk during an outbreak, and people who travel to or live in countries with a high rate of meningitis should be immunized. A first-year college student up through age 79 years who is living in a residence hall should receive a dose if he did not receive a  dose on or after his 16th birthday. Adults who have certain high-risk conditions should receive one or more doses of vaccine.  Hepatitis A vaccine. Adults who wish to be protected from this disease, have certain high-risk conditions, work with hepatitis A-infected animals, work in hepatitis A research labs, or travel to or work in countries with a high rate of hepatitis A should be immunized. Adults who were previously unvaccinated and who anticipate close contact with an international adoptee during the first 60 days after arrival in the [place] from a country with a high rate of hepatitis A should be immunized.  Hepatitis B vaccine. Adults should be immunized if they wish to be protected from this disease, have certain high-risk conditions, may be exposed to blood or other infectious body fluids, are household contacts or sex partners of hepatitis B positive people, are clients or workers in certain care facilities, or travel to or work in countries with a high rate of hepatitis B.  Haemophilus influenzae type b (Hib) vaccine. A previously unvaccinated person with asplenia or sickle cell disease or having a scheduled splenectomy should receive 1 dose of Hib vaccine. Regardless of previous immunization, a recipient of a hematopoietic stem cell transplant should receive a 3-dose series 6-12 months after his successful transplant. Hib vaccine is not recommended for adults  with HIV infection. Preventive Service / Frequency Ages 72 to 70  Blood pressure check.** / Every 1 to 2 years.  Lipid and cholesterol check.** / Every 5 years beginning at age 60.  Hepatitis C blood test.** / For any individual with known risks for hepatitis C.  Skin self-exam. / Monthly.  Influenza vaccine. / Every year.  Tetanus, diphtheria, and acellular pertussis (Tdap, Td) vaccine.** / Consult your health care provider. 1 dose of Td every 10 years.  Varicella vaccine.** / Consult your health care provider.  HPV vaccine. / 3 doses over 6 months, if 69 or younger.  Measles, mumps, rubella (MMR) vaccine.** / You need at least 1 dose of MMR if you were born in 1957 or later. You may also need a second dose.  Pneumococcal 13-valent conjugate (PCV13) vaccine.** / Consult your health care provider.  Pneumococcal polysaccharide (PPSV23) vaccine.** / 1 to 2 doses if you smoke cigarettes or if you have certain conditions.  Meningococcal vaccine.** / 1 dose if you are age 56 to 39 years and a Market researcher living in a residence hall, or have one of several medical conditions. You may also need additional booster doses.  Hepatitis A vaccine.** / Consult your health care provider.  Hepatitis B vaccine.** / Consult your health care provider.  Haemophilus influenzae type b (Hib) vaccine.** / Consult your health care provider. Ages 67 to 24  Blood pressure check.** / Every 1 to 2 years.  Lipid and cholesterol check.** / Every 5 years beginning at age 78.  Lung cancer screening. / Every year if you are aged 35-80 years and have a 30-pack-year history of smoking and currently smoke or have quit within the past 15 years. Yearly screening is stopped once you have quit smoking for at least 15 years or develop a health problem that would prevent you from having lung cancer treatment.  Fecal occult blood test (FOBT) of stool. / Every year beginning at age 68 and continuing until  age 3. You may not have to do this test if you get a colonoscopy every 10 years.  Flexible sigmoidoscopy** or colonoscopy.** / Every 5 years for a flexible  sigmoidoscopy or every 10 years for a colonoscopy beginning at age 21 and continuing until age 68.  Hepatitis C blood test.** / For all people born from [birth year] through 1965 and any individual with known risks for hepatitis C.  Skin self-exam. / Monthly.  Influenza vaccine. / Every year.  Tetanus, diphtheria, and acellular pertussis (Tdap/Td) vaccine.** / Consult your health care provider. 1 dose of Td every 10 years.  Varicella vaccine.** / Consult your health care provider.  Zoster vaccine.** / 1 dose for adults aged 30 years or older.  Measles, mumps, rubella (MMR) vaccine.** / You need at least 1 dose of MMR if you were born in 1957 or later. You may also need a second dose.  Pneumococcal 13-valent conjugate (PCV13) vaccine.** / Consult your health care provider.  Pneumococcal polysaccharide (PPSV23) vaccine.** / 1 to 2 doses if you smoke cigarettes or if you have certain conditions.  Meningococcal vaccine.** / Consult your health care provider.  Hepatitis A vaccine.** / Consult your health care provider.  Hepatitis B vaccine.** / Consult your health care provider.  Haemophilus influenzae type b (Hib) vaccine.** / Consult your health care provider. Ages 55 and over  Blood pressure check.** / Every 1 to 2 years.  Lipid and cholesterol check.**/ Every 5 years beginning at age 27.  Lung cancer screening. / Every year if you are aged 35-80 years and have a 30-pack-year history of smoking and currently smoke or have quit within the past 15 years. Yearly screening is stopped once you have quit smoking for at least 15 years or develop a health problem that would prevent you from having lung cancer treatment.  Fecal occult blood test (FOBT) of stool. / Every year beginning at age 64 and continuing until age 106. You may not have to  do this test if you get a colonoscopy every 10 years.  Flexible sigmoidoscopy** or colonoscopy.** / Every 5 years for a flexible sigmoidoscopy or every 10 years for a colonoscopy beginning at age 23 and continuing until age 61.  Hepatitis C blood test.** / For all people born from [birth year] through 1965 and any individual with known risks for hepatitis C.  Abdominal aortic aneurysm (AAA) screening.** / A one-time screening for ages 24 to 91 years who are current or former smokers.  Skin self-exam. / Monthly.  Influenza vaccine. / Every year.  Tetanus, diphtheria, and acellular pertussis (Tdap/Td) vaccine.** / 1 dose of Td every 10 years.  Varicella vaccine.** / Consult your health care provider.  Zoster vaccine.** / 1 dose for adults aged 42 years or older.  Pneumococcal 13-valent conjugate (PCV13) vaccine.** / Consult your health care provider.  Pneumococcal polysaccharide (PPSV23) vaccine.** / 1 dose for all adults aged 2 years and older.  Meningococcal vaccine.** / Consult your health care provider.  Hepatitis A vaccine.** / Consult your health care provider.  Hepatitis B vaccine.** / Consult your health care provider.  Haemophilus influenzae type b (Hib) vaccine.** / Consult your health care provider. **Family history and personal history of risk and conditions may change your health care provider's recommendations. Document Released: 06/05/2001 Document Revised: 04/14/2013 Document Reviewed: 09/04/2010 [facility] Patient Information 2015 [place], [place]. This information is not intended to replace advice given to you by your health care provider. Make sure you discuss any questions you have with your health care provider.

## 2014-06-16 NOTE — Assessment & Plan Note (Signed)
Will obtain lab workup today to further assess.  Supportive measures discussed with patient.

## 2014-06-16 NOTE — Progress Notes (Signed)
Patient presents to clinic today for annual exam.  Patient is fasting for labs.  Patient c/o intermittent fatigue x 2 months.  Denies change in stress, diet or exercise regimen.  Does have Alpha Thalassemia trait which causes him occasional fatigue but usually only mild.  States this is different.  Denies fever, chills or rash.  Denies recent travel or sick contact.  Denies changes in weight.  Health Maintenance: Dental -- up-to-date Vision -- up-to-date Immunizations -- up-to-date Colonoscopy -- up-to-date  Past Medical History  Diagnosis Date  . Anemia   . Chicken pox   . Alpha thalassemia trait   . Hyperlipidemia   . H/O removal of thyroglossal duct cyst     Past Surgical History  Procedure Laterality Date  . Thyroid cyst excision  1975    No current outpatient prescriptions on file prior to visit.   No current facility-administered medications on file prior to visit.    No Known Allergies  Family History  Problem Relation Age of Onset  . Hyperlipidemia Father   . Dementia Father   . Anemia Mother   . Dementia Paternal Grandmother   . Diabetes Maternal Grandmother   . Crohn's disease Paternal Uncle   . Anemia Brother   . Healthy Sister     x2  . Colon cancer Neg Hx   . Osteoporosis Mother   . Thyroid disease Father   . Colon polyps Father     History   Social History  . Marital Status: Divorced    Spouse Name: N/A  . Number of Children: N/A  . Years of Education: N/A   Occupational History  . Not on file.   Social History Main Topics  . Smoking status: Former Smoker    Types: Cigars  . Smokeless tobacco: Never Used     Comment: Cigars Only Socially.  . Alcohol Use: Yes     Comment: rare  . Drug Use: No  . Sexual Activity: Yes    Birth Control/ Protection: None   Other Topics Concern  . Not on file   Social History Narrative   Pt Divorced, 2 children, 1 grandchild   Occupation: Museum/gallery conservator: [employer]   Hobbies: rock climbing         Review of Systems  Constitutional: Positive for malaise/fatigue. Negative for fever and weight loss.  HENT: Negative for ear discharge, ear pain, hearing loss and tinnitus.   Eyes: Negative for blurred vision, double vision, photophobia and pain.  Respiratory: Negative for cough and shortness of breath.   Cardiovascular: Negative for chest pain and palpitations.  Gastrointestinal: Negative for heartburn, nausea, vomiting, abdominal pain, diarrhea, constipation, blood in stool and melena.  Genitourinary: Negative for dysuria, urgency, frequency, hematuria and flank pain.  Musculoskeletal: Negative for falls.  Neurological: Negative for dizziness, loss of consciousness and headaches.  Endo/Heme/Allergies: Negative for environmental allergies.  Psychiatric/Behavioral: Negative for depression, suicidal ideas, hallucinations and substance abuse. The patient is not nervous/anxious and does not have insomnia.    BP 116/80 mmHg  Pulse 53  Temp(Src) 97.9 F (36.6 C) (Oral)  Resp 16  Ht  (1.778 m)  Wt 187 lb 6 oz (84.993 kg)  BMI 26.89 kg/m2  SpO2 100%  Physical Exam  Constitutional: He is oriented to person, place, and time and well-developed, well-nourished, and in no distress.  HENT:  Head: Normocephalic and atraumatic.  Right Ear: External ear normal.  Left Ear: External ear normal.  Nose: Nose normal.  Mouth/Throat: Oropharynx is clear and moist. No oropharyngeal exudate.  Eyes: Conjunctivae and EOM are normal. Pupils are equal, round, and reactive to light.  Neck: Neck supple. No thyromegaly present.  Cardiovascular: Normal rate, regular rhythm, normal heart sounds and intact distal pulses.   Pulmonary/Chest: Effort normal and breath sounds normal. No respiratory distress. He has no wheezes. He has no rales. He exhibits no tenderness.  Abdominal: Soft. Bowel sounds are normal. He exhibits no distension and no mass. There is no tenderness. There  is no rebound and no guarding.  Genitourinary: Testes/scrotum normal and penis normal. No discharge found.  Lymphadenopathy:    He has no cervical adenopathy.  Neurological: He is alert and oriented to person, place, and time.  Skin: Skin is warm and dry. No rash noted.  Psychiatric: Affect normal.  Vitals reviewed.  Assessment/Plan: Encounter for preventive health examination I have reviewed the patient's medical history in detail and updated the computerized patient record.  PHQ-2 Depression screen performed with score of 0.  Health Maintenance up-to-date. Will obtain fasting labs today.  Preventive care discussed.  Handout given.    Fatigue Will obtain lab workup today to further assess.  Supportive measures discussed with patient.

## 2014-06-16 NOTE — Addendum Note (Signed)
Addended by: Marcelline MatesMARTIN, Larinda Herter on: 06/16/2014 08:42 AM   Modules accepted: K[employer] BroodSmartSet

## 2014-06-16 NOTE — Progress Notes (Signed)
Pre visit review using our clinic review tool, if applicable. No additional management support is needed unless otherwise documented below in the visit note/SLS  

## 2014-06-18 ENCOUNTER — Encounter: Payer: Self-pay | Admitting: Physician Assistant

## 2014-06-21 ENCOUNTER — Other Ambulatory Visit: Payer: Self-pay | Admitting: Physician Assistant

## 2014-06-21 ENCOUNTER — Telehealth: Payer: Self-pay | Admitting: Physician Assistant

## 2014-06-21 DIAGNOSIS — R5383 Other fatigue: Secondary | ICD-10-CM

## 2014-06-21 NOTE — Telephone Encounter (Signed)
LMOVM to inform patient that we would indicate if he needed to be fasting when we request the appointment to be made, no fasting required/SLS

## 2014-06-21 NOTE — Telephone Encounter (Signed)
Caller name:Jeffery Gillentine Relationship to patient:self Can be reached:[phone]   Reason for call: Pt made appt for lab 03/02 per request from nurse call 06/18/14. Please inform if a fasting appt.

## 2014-06-23 ENCOUNTER — Other Ambulatory Visit (INDEPENDENT_AMBULATORY_CARE_PROVIDER_SITE_OTHER): Payer: BLUE CROSS/BLUE SHIELD

## 2014-06-23 DIAGNOSIS — R5383 Other fatigue: Secondary | ICD-10-CM

## 2014-06-23 LAB — FERRITIN: Ferritin: 102.6 ng/mL (ref 22.0–322.0)

## 2014-06-24 LAB — EPSTEIN-BARR VIRUS VCA ANTIBODY PANEL
EBV EA IGG: 40.2 U/mL — AB (ref <9.0)
EBV NA IGG: 22.7 U/mL — AB (ref <18.0)
EBV VCA IGG: 186 U/mL — AB (ref <18.0)
EBV VCA IgM: <10 U/mL (ref <36.0)

## 2014-06-24 LAB — B. BURGDORFI ANTIBODIES: B BURGDORFERI AB IGG+ IGM: 0.92 {ISR} — AB

## 2014-06-25 LAB — B. BURGDORFI ANTIBODIES BY WB
B burgdorferi IgG Abs (IB): NEGATIVE
B burgdorferi IgM Abs (IB): NEGATIVE

## 2015-04-24 HISTORY — PX: WISDOM TOOTH EXTRACTION: SHX21

## 2015-06-06 ENCOUNTER — Encounter: Payer: BLUE CROSS/BLUE SHIELD | Admitting: Physician Assistant

## 2015-06-17 ENCOUNTER — Encounter: Payer: Self-pay | Admitting: Behavioral Health

## 2015-06-17 ENCOUNTER — Telehealth: Payer: Self-pay | Admitting: Behavioral Health

## 2015-06-17 NOTE — Telephone Encounter (Signed)
Pre-Visit Call completed with patient and chart updated.   Pre-Visit Info documented in Specialty Comments under [employer].    

## 2015-06-20 ENCOUNTER — Ambulatory Visit (INDEPENDENT_AMBULATORY_CARE_PROVIDER_SITE_OTHER): Payer: BLUE CROSS/BLUE SHIELD | Admitting: Physician Assistant

## 2015-06-20 ENCOUNTER — Encounter: Payer: Self-pay | Admitting: Physician Assistant

## 2015-06-20 VITALS — BP 124/76 | HR 57 | Temp 97.8°F | Ht 70.0 in | Wt 186.0 lb

## 2015-06-20 DIAGNOSIS — Z0000 Encounter for general adult medical examination without abnormal findings: Secondary | ICD-10-CM | POA: Insufficient documentation

## 2015-06-20 DIAGNOSIS — Z125 Encounter for screening for malignant neoplasm of prostate: Secondary | ICD-10-CM | POA: Insufficient documentation

## 2015-06-20 LAB — CBC
HEMATOCRIT: 39.7 % (ref 39.0–52.0)
Hemoglobin: 12.6 g/dL — ABNORMAL LOW (ref 13.0–17.0)
MCHC: 31.7 g/dL (ref 30.0–36.0)
Platelets: 224 10*3/uL (ref 150.0–400.0)
RBC: 6.65 Mil/uL — ABNORMAL HIGH (ref 4.22–5.81)
RDW: 17.8 % — AB (ref 11.5–15.5)
WBC: 5 10*3/uL (ref 4.0–10.5)

## 2015-06-20 LAB — COMPREHENSIVE METABOLIC PANEL
ALT: 34 U/L (ref 0–53)
AST: 21 U/L (ref 0–37)
Albumin: 4.4 g/dL (ref 3.5–5.2)
Alkaline Phosphatase: 45 U/L (ref 39–117)
BUN: 13 mg/dL (ref 6–23)
CHLORIDE: 103 meq/L (ref 96–112)
CO2: 30 mEq/L (ref 19–32)
CREATININE: 0.92 mg/dL (ref 0.40–1.50)
Calcium: 9.7 mg/dL (ref 8.4–10.5)
GFR: 90.52 mL/min (ref >60.00)
GLUCOSE: 96 mg/dL (ref 70–99)
POTASSIUM: 4 meq/L (ref 3.5–5.1)
SODIUM: 139 meq/L (ref 135–145)
Total Bilirubin: 2.3 mg/dL — ABNORMAL HIGH (ref 0.2–1.2)
Total Protein: 7 g/dL (ref 6.0–8.3)

## 2015-06-20 LAB — URINALYSIS, ROUTINE W REFLEX MICROSCOPIC
BILIRUBIN URINE: NEGATIVE
HGB URINE DIPSTICK: NEGATIVE
KETONES UR: NEGATIVE
LEUKOCYTES UA: NEGATIVE
NITRITE: NEGATIVE
PH: 6 (ref 5.0–8.0)
RBC / HPF: NONE SEEN (ref 0–?)
Specific Gravity, Urine: <=1.005 — AB (ref 1.000–1.030)
Total Protein, Urine: NEGATIVE
UROBILINOGEN UA: 0.2 (ref 0.0–1.0)
Urine Glucose: NEGATIVE
WBC UA: NONE SEEN (ref 0–?)

## 2015-06-20 LAB — LIPID PANEL
CHOL/HDL RATIO: 4
CHOLESTEROL: 220 mg/dL — AB (ref 0–200)
HDL: 51.1 mg/dL (ref >39.00)
LDL Cholesterol: 152 mg/dL — ABNORMAL HIGH (ref 0–99)
NonHDL: 168.8
TRIGLYCERIDES: 85 mg/dL (ref 0.0–149.0)
VLDL: 17 mg/dL (ref 0.0–40.0)

## 2015-06-20 LAB — HEMOGLOBIN A1C: Hgb A1c MFr Bld: 5.3 % (ref 4.6–6.5)

## 2015-06-20 LAB — PSA: PSA: 0.91 ng/mL (ref 0.10–4.00)

## 2015-06-20 LAB — TSH: TSH: 0.77 u[IU]/mL (ref 0.35–4.50)

## 2015-06-20 NOTE — Assessment & Plan Note (Signed)
Depression screen negative. Health Maintenance reviewed -- Immunizations and colonoscopy up-to-date. Preventive schedule discussed and handout given in AVS. Will obtain fasting labs today.  

## 2015-06-20 NOTE — Patient Instructions (Signed)
Please go to the lab for blood work.  I will call you with your results. If your blood work is normal we will follow-up yearly for physicals.   Make sure to speak with your insurance representative about coverage for Hepatitis C screening.  Preventive Care for Adults, Male A healthy lifestyle and preventive care can promote health and wellness. Preventive health guidelines for men include the following key practices:  A routine yearly physical is a good way to check with your health care provider about your health and preventative screening. It is a chance to share any concerns and updates on your health and to receive a thorough exam.  Visit your dentist for a routine exam and preventative care every 6 months. Brush your teeth twice a day and floss once a day. Good oral hygiene prevents tooth decay and gum disease.  The frequency of eye exams is based on your age, health, family medical history, use of contact lenses, and other factors. Follow your health care provider's recommendations for frequency of eye exams.  Eat a healthy diet. Foods such as vegetables, fruits, whole grains, low-fat dairy products, and lean protein foods contain the nutrients you need without too many calories. Decrease your intake of foods high in solid fats, added sugars, and salt. Eat the right amount of calories for you.Get information about a proper diet from your health care provider, if necessary.  Regular physical exercise is one of the most important things you can do for your health. Most adults should get at least 150 minutes of moderate-intensity exercise (any activity that increases your heart rate and causes you to sweat) each week. In addition, most adults need muscle-strengthening exercises on 2 or more days a week.  Maintain a healthy weight. The body mass index (BMI) is a screening tool to identify possible weight problems. It provides an estimate of body fat based on height and weight. Your health care  provider can find your BMI and can help you achieve or maintain a healthy weight.For adults 20 years and older:  A BMI below 18.5 is considered underweight.  A BMI of 18.5 to 24.9 is normal.  A BMI of 25 to 29.9 is considered overweight.  A BMI of 30 and above is considered obese.  Maintain normal blood lipids and cholesterol levels by exercising and minimizing your intake of saturated fat. Eat a balanced diet with plenty of fruit and vegetables. Blood tests for lipids and cholesterol should begin at age 67 and be repeated every 5 years. If your lipid or cholesterol levels are high, you are over 50, or you are at high risk for heart disease, you may need your cholesterol levels checked more frequently.Ongoing high lipid and cholesterol levels should be treated with medicines if diet and exercise are not working.  If you smoke, find out from your health care provider how to quit. If you do not use tobacco, do not start.  Lung cancer screening is recommended for adults aged 65-80 years who are at high risk for developing lung cancer because of a history of smoking. A yearly low-dose CT scan of the lungs is recommended for people who have at least a 30-pack-year history of smoking and are a current smoker or have quit within the past 15 years. A pack year of smoking is smoking an average of 1 pack of cigarettes a day for 1 year (for example: 1 pack a day for 30 years or 2 packs a day for 15 years). Yearly screening  should continue until the smoker has stopped smoking for at least 15 years. Yearly screening should be stopped for people who develop a health problem that would prevent them from having lung cancer treatment.  If you choose to drink alcohol, do not have more than 2 drinks per day. One drink is considered to be 12 ounces (355 mL) of beer, 5 ounces (148 mL) of wine, or 1.5 ounces (44 mL) of liquor.  Avoid use of street drugs. Do not share needles with anyone. Ask for help if you need  support or instructions about stopping the use of drugs.  High blood pressure causes heart disease and increases the risk of stroke. Your blood pressure should be checked at least every 1-2 years. Ongoing high blood pressure should be treated with medicines, if weight loss and exercise are not effective.  If you are 41-67 years old, ask your health care provider if you should take aspirin to prevent heart disease.  Diabetes screening is done by taking a blood sample to check your blood glucose level after you have not eaten for a certain period of time (fasting). If you are not overweight and you do not have risk factors for diabetes, you should be screened once every 3 years starting at age 68. If you are overweight or obese and you are 36-67 years of age, you should be screened for diabetes every year as part of your cardiovascular risk assessment.  Colorectal cancer can be detected and often prevented. Most routine colorectal cancer screening begins at the age of 47 and continues through age 24. However, your health care provider may recommend screening at an earlier age if you have risk factors for colon cancer. On a yearly basis, your health care provider may provide home test kits to check for hidden blood in the stool. Use of a small camera at the end of a tube to directly examine the colon (sigmoidoscopy or colonoscopy) can detect the earliest forms of colorectal cancer. Talk to your health care provider about this at age 41, when routine screening begins. Direct exam of the colon should be repeated every 5-10 years through age 16, unless early forms of precancerous polyps or small growths are found.  People who are at an increased risk for hepatitis B should be screened for this virus. You are considered at high risk for hepatitis B if:  You were born in a country where hepatitis B occurs often. Talk with your health care provider about which countries are considered high risk.  Your parents  were born in a high-risk country and you have not received a shot to protect against hepatitis B (hepatitis B vaccine).  You have HIV or AIDS.  You use needles to inject street drugs.  You live with, or have sex with, someone who has hepatitis B.  You are a man who has sex with other men (MSM).  You get hemodialysis treatment.  You take certain medicines for conditions such as cancer, organ transplantation, and autoimmune conditions.  Hepatitis C blood testing is recommended for all people born from [birth year] through 1965 and any individual with known risks for hepatitis C.  Practice safe sex. Use condoms and avoid high-risk sexual practices to reduce the spread of sexually transmitted infections (STIs). STIs include gonorrhea, chlamydia, syphilis, trichomonas, herpes, HPV, and human immunodeficiency virus (HIV). Herpes, HIV, and HPV are viral illnesses that have no cure. They can result in disability, cancer, and death.  If you are a man who has  sex with other men, you should be screened at least once per year for:  HIV.  Urethral, rectal, and pharyngeal infection of gonorrhea, chlamydia, or both.  If you are at risk of being infected with HIV, it is recommended that you take a prescription medicine daily to prevent HIV infection. This is called preexposure prophylaxis (PrEP). You are considered at risk if:  You are a man who has sex with other men (MSM) and have other risk factors.  You are a heterosexual man, are sexually active, and are at increased risk for HIV infection.  You take drugs by injection.  You are sexually active with a partner who has HIV.  Talk with your health care provider about whether you are at high risk of being infected with HIV. If you choose to begin PrEP, you should first be tested for HIV. You should then be tested every 3 months for as long as you are taking PrEP.  A one-time screening for abdominal aortic aneurysm (AAA) and surgical repair of large AAAs  by ultrasound are recommended for men ages 54 to 31 years who are current or former smokers.  Healthy men should no longer receive prostate-specific antigen (PSA) blood tests as part of routine cancer screening. Talk with your health care provider about prostate cancer screening.  Testicular cancer screening is not recommended for adult males who have no symptoms. Screening includes self-exam, a health care provider exam, and other screening tests. Consult with your health care provider about any symptoms you have or any concerns you have about testicular cancer.  Use sunscreen. Apply sunscreen liberally and repeatedly throughout the day. You should seek shade when your shadow is shorter than you. Protect yourself by wearing long sleeves, pants, a wide-brimmed hat, and sunglasses year round, whenever you are outdoors.  Once a month, do a whole-body skin exam, using a mirror to look at the skin on your back. Tell your health care provider about new moles, moles that have irregular borders, moles that are larger than a pencil eraser, or moles that have changed in shape or color.  Stay current with required vaccines (immunizations).  Influenza vaccine. All adults should be immunized every year.  Tetanus, diphtheria, and acellular pertussis (Td, Tdap) vaccine. An adult who has not previously received Tdap or who does not know his vaccine status should receive 1 dose of Tdap. This initial dose should be followed by tetanus and diphtheria toxoids (Td) booster doses every 10 years. Adults with an unknown or incomplete history of completing a 3-dose immunization series with Td-containing vaccines should begin or complete a primary immunization series including a Tdap dose. Adults should receive a Td booster every 10 years.  Varicella vaccine. An adult without evidence of immunity to varicella should receive 2 doses or a second dose if he has previously received 1 dose.  Human papillomavirus (HPV) vaccine.  Males aged 11-21 years who have not received the vaccine previously should receive the 3-dose series. Males aged 22-26 years may be immunized. Immunization is recommended through the age of 70 years for any male who has sex with males and did not get any or all doses earlier. Immunization is recommended for any person with an immunocompromised condition through the age of 16 years if he did not get any or all doses earlier. During the 3-dose series, the second dose should be obtained 4-8 weeks after the first dose. The third dose should be obtained 24 weeks after the first dose and 16 weeks after  the second dose.  Zoster vaccine. One dose is recommended for adults aged 60 years or older unless certain conditions are present.  Measles, mumps, and rubella (MMR) vaccine. Adults born before 1957 generally are considered immune to measles and mumps. Adults born in 1957 or later should have 1 or more doses of MMR vaccine unless there is a contraindication to the vaccine or there is laboratory evidence of immunity to each of the three diseases. A routine second dose of MMR vaccine should be obtained at least 28 days after the first dose for students attending postsecondary schools, health care workers, or international travelers. People who received inactivated measles vaccine or an unknown type of measles vaccine during 1963-1967 should receive 2 doses of MMR vaccine. People who received inactivated mumps vaccine or an unknown type of mumps vaccine before 1979 and are at high risk for mumps infection should consider immunization with 2 doses of MMR vaccine. Unvaccinated health care workers born before 1957 who lack laboratory evidence of measles, mumps, or rubella immunity or laboratory confirmation of disease should consider measles and mumps immunization with 2 doses of MMR vaccine or rubella immunization with 1 dose of MMR vaccine.  Pneumococcal 13-valent conjugate (PCV13) vaccine. When indicated, a person who is  uncertain of his immunization history and has no record of immunization should receive the PCV13 vaccine. All adults 65 years of age and older should receive this vaccine. An adult aged 19 years or older who has certain medical conditions and has not been previously immunized should receive 1 dose of PCV13 vaccine. This PCV13 should be followed with a dose of pneumococcal polysaccharide (PPSV23) vaccine. Adults who are at high risk for pneumococcal disease should obtain the PPSV23 vaccine at least 8 weeks after the dose of PCV13 vaccine. Adults older than 56 years of age who have normal immune system function should obtain the PPSV23 vaccine dose at least 1 year after the dose of PCV13 vaccine.  Pneumococcal polysaccharide (PPSV23) vaccine. When PCV13 is also indicated, PCV13 should be obtained first. All adults aged 65 years and older should be immunized. An adult younger than age 65 years who has certain medical conditions should be immunized. Any person who resides in a nursing home or long-term care facility should be immunized. An adult smoker should be immunized. People with an immunocompromised condition and certain other conditions should receive both PCV13 and PPSV23 vaccines. People with human immunodeficiency virus (HIV) infection should be immunized as soon as possible after diagnosis. Immunization during chemotherapy or radiation therapy should be avoided. Routine use of PPSV23 vaccine is not recommended for American Indians, [place] Natives, or people younger than 65 years unless there are medical conditions that require PPSV23 vaccine. When indicated, people who have unknown immunization and have no record of immunization should receive PPSV23 vaccine. One-time revaccination 5 years after the first dose of PPSV23 is recommended for people aged 19-64 years who have chronic kidney failure, nephrotic syndrome, asplenia, or immunocompromised conditions. People who received 1-2 doses of PPSV23 before age  65 years should receive another dose of PPSV23 vaccine at age 65 years or later if at least 5 years have passed since the previous dose. Doses of PPSV23 are not needed for people immunized with PPSV23 at or after age 65 years.  Meningococcal vaccine. Adults with asplenia or persistent complement component deficiencies should receive 2 doses of quadrivalent meningococcal conjugate (MenACWY-D) vaccine. The doses should be obtained at least 2 months apart. Microbiologists working with certain meningococcal bacteria,   military recruits, people at risk during an outbreak, and people who travel to or live in countries with a high rate of meningitis should be immunized. A first-year college student up through age 21 years who is living in a residence hall should receive a dose if he did not receive a dose on or after his 16th birthday. Adults who have certain high-risk conditions should receive one or more doses of vaccine.  Hepatitis A vaccine. Adults who wish to be protected from this disease, have chronic liver disease, work with hepatitis A-infected animals, work in hepatitis A research labs, or travel to or work in countries with a high rate of hepatitis A should be immunized. Adults who were previously unvaccinated and who anticipate close contact with an international adoptee during the first 60 days after arrival in the [place] from a country with a high rate of hepatitis A should be immunized.  Hepatitis B vaccine. Adults should be immunized if they wish to be protected from this disease, are under age 59 years and have diabetes, have chronic liver disease, have had more than one sex partner in the past 6 months, may be exposed to blood or other infectious body fluids, are household contacts or sex partners of hepatitis B positive people, are clients or workers in certain care facilities, or travel to or work in countries with a high rate of hepatitis B.  Haemophilus influenzae type b (Hib) vaccine. A  previously unvaccinated person with asplenia or sickle cell disease or having a scheduled splenectomy should receive 1 dose of Hib vaccine. Regardless of previous immunization, a recipient of a hematopoietic stem cell transplant should receive a 3-dose series 6-12 months after his successful transplant. Hib vaccine is not recommended for adults with HIV infection. Preventive Service / Frequency Ages 19 to 39  Blood pressure check.** / Every 3-5 years.  Lipid and cholesterol check.** / Every 5 years beginning at age 20.  Hepatitis C blood test.** / For any individual with known risks for hepatitis C.  Skin self-exam. / Monthly.  Influenza vaccine. / Every year.  Tetanus, diphtheria, and acellular pertussis (Tdap, Td) vaccine.** / Consult your health care provider. 1 dose of Td every 10 years.  Varicella vaccine.** / Consult your health care provider.  HPV vaccine. / 3 doses over 6 months, if 26 or younger.  Measles, mumps, rubella (MMR) vaccine.** / You need at least 1 dose of MMR if you were born in 1957 or later. You may also need a second dose.  Pneumococcal 13-valent conjugate (PCV13) vaccine.** / Consult your health care provider.  Pneumococcal polysaccharide (PPSV23) vaccine.** / 1 to 2 doses if you smoke cigarettes or if you have certain conditions.  Meningococcal vaccine.** / 1 dose if you are age 19 to 21 years and a first-year college student living in a residence hall, or have one of several medical conditions. You may also need additional booster doses.  Hepatitis A vaccine.** / Consult your health care provider.  Hepatitis B vaccine.** / Consult your health care provider.  Haemophilus influenzae type b (Hib) vaccine.** / Consult your health care provider. Ages 40 to 64  Blood pressure check.** / Every year.  Lipid and cholesterol check.** / Every 5 years beginning at age 20.  Lung cancer screening. / Every year if you are aged 55-80 years and have a 30-pack-year  history of smoking and currently smoke or have quit within the past 15 years. Yearly screening is stopped once you have quit smoking   for at least 15 years or develop a health problem that would prevent you from having lung cancer treatment.  Fecal occult blood test (FOBT) of stool. / Every year beginning at age 69 and continuing until age 15. You may not have to do this test if you get a colonoscopy every 10 years.  Flexible sigmoidoscopy** or colonoscopy.** / Every 5 years for a flexible sigmoidoscopy or every 10 years for a colonoscopy beginning at age 60 and continuing until age 15.  Hepatitis C blood test.** / For all people born from [birth year] through 1965 and any individual with known risks for hepatitis C.  Skin self-exam. / Monthly.  Influenza vaccine. / Every year.  Tetanus, diphtheria, and acellular pertussis (Tdap/Td) vaccine.** / Consult your health care provider. 1 dose of Td every 10 years.  Varicella vaccine.** / Consult your health care provider.  Zoster vaccine.** / 1 dose for adults aged 31 years or older.  Measles, mumps, rubella (MMR) vaccine.** / You need at least 1 dose of MMR if you were born in 1957 or later. You may also need a second dose.  Pneumococcal 13-valent conjugate (PCV13) vaccine.** / Consult your health care provider.  Pneumococcal polysaccharide (PPSV23) vaccine.** / 1 to 2 doses if you smoke cigarettes or if you have certain conditions.  Meningococcal vaccine.** / Consult your health care provider.  Hepatitis A vaccine.** / Consult your health care provider.  Hepatitis B vaccine.** / Consult your health care provider.  Haemophilus influenzae type b (Hib) vaccine.** / Consult your health care provider. Ages 73 and over  Blood pressure check.** / Every year.  Lipid and cholesterol check.**/ Every 5 years beginning at age 59.  Lung cancer screening. / Every year if you are aged 34-80 years and have a 30-pack-year history of smoking and currently  smoke or have quit within the past 15 years. Yearly screening is stopped once you have quit smoking for at least 15 years or develop a health problem that would prevent you from having lung cancer treatment.  Fecal occult blood test (FOBT) of stool. / Every year beginning at age 65 and continuing until age 67. You may not have to do this test if you get a colonoscopy every 10 years.  Flexible sigmoidoscopy** or colonoscopy.** / Every 5 years for a flexible sigmoidoscopy or every 10 years for a colonoscopy beginning at age 65 and continuing until age 2.  Hepatitis C blood test.** / For all people born from [birth year] through 1966 and any individual with known risks for hepatitis C.  Abdominal aortic aneurysm (AAA) screening.** / A one-time screening for ages 62 to 54 years who are current or former smokers.  Skin self-exam. / Monthly.  Influenza vaccine. / Every year.  Tetanus, diphtheria, and acellular pertussis (Tdap/Td) vaccine.** / 1 dose of Td every 10 years.  Varicella vaccine.** / Consult your health care provider.  Zoster vaccine.** / 1 dose for adults aged 55 years or older.  Pneumococcal 13-valent conjugate (PCV13) vaccine.** / 1 dose for all adults aged 46 years and older.  Pneumococcal polysaccharide (PPSV23) vaccine.** / 1 dose for all adults aged 54 years and older.  Meningococcal vaccine.** / Consult your health care provider.  Hepatitis A vaccine.** / Consult your health care provider.  Hepatitis B vaccine.** / Consult your health care provider.  Haemophilus influenzae type b (Hib) vaccine.** / Consult your health care provider. **Family history and personal history of risk and conditions may change your health care provider's recommendations.  This information is not intended to replace advice given to you by your health care provider. Make sure you discuss any questions you have with your health care provider.   Document Released: 06/05/2001 Document Revised:  04/30/2014 Document Reviewed: 09/04/2010 [employer]vier Interactive Patient Education [employer].

## 2015-06-20 NOTE — Progress Notes (Signed)
Patient presents to clinic today for annual exam.  Patient is fasting for labs. Denies acute concerns today.  Has history of alpha thalassemia trait without history of problems from illness. Body mass index is 26.69 kg/(m^2). Endorses well-balanced diet.  Health Maintenance: Immunizations --up-to-date Colonoscopy -- up-to-date  Past Medical History  Diagnosis Date  . Anemia   . Chicken pox   . Alpha thalassemia trait   . Hyperlipidemia   . H/O removal of thyroglossal duct cyst     Past Surgical History  Procedure Laterality Date  . Thyroid cyst excision  1975    No current outpatient prescriptions on file prior to visit.   No current facility-administered medications on file prior to visit.    No Known Allergies  Family History  Problem Relation Age of Onset  . Hyperlipidemia Father   . Dementia Father   . Anemia Mother   . Dementia Paternal Grandmother   . Diabetes Maternal Grandmother   . Crohn's disease Paternal Uncle   . Anemia Brother   . Healthy Sister     x2  . Colon cancer Neg Hx   . Osteoporosis Mother   . Thyroid disease Father   . Colon polyps Father     Social History   Social History  . Marital Status: Divorced    Spouse Name: N/A  . Number of Children: N/A  . Years of Education: N/A   Occupational History  . Not on file.   Social History Main Topics  . Smoking status: Former Smoker    Types: Cigars  . Smokeless tobacco: Never Used     Comment: Cigars Only Socially.  . Alcohol Use: 0.0 oz/week    0 Standard drinks or equivalent per week     Comment: rare  . Drug Use: No  . Sexual Activity: Yes    Birth Control/ Protection: None   Other Topics Concern  . Not on file   Social History Narrative   Pt Divorced, 2 children, 1 grandchild   Occupation: Patent attorney: [employer]   Hobbies: rock climbing         Review of Systems  Constitutional: Negative for fever and weight loss.  HENT: Negative for ear  discharge, ear pain, hearing loss and tinnitus.   Eyes: Negative for blurred vision, double vision, photophobia and pain.  Respiratory: Negative for cough and shortness of breath.   Cardiovascular: Negative for chest pain and palpitations.  Gastrointestinal: Negative for heartburn, nausea, vomiting, abdominal pain, diarrhea, constipation, blood in stool and melena.  Genitourinary: Negative for dysuria, urgency, frequency, hematuria and flank pain.  Musculoskeletal: Negative for falls.  Neurological: Negative for dizziness, loss of consciousness and headaches.  Endo/Heme/Allergies: Negative for environmental allergies.  Psychiatric/Behavioral: Negative for depression, suicidal ideas, hallucinations and substance abuse. The patient is not nervous/anxious and does not have insomnia.     BP 124/76 mmHg  Pulse 57  Temp(Src) 97.8 F (36.6 C) (Oral)  Ht '5\' 10"'  (1.778 m)  Wt 186 lb (84.369 kg)  BMI 26.69 kg/m2  SpO2 100%  Physical Exam  Constitutional: He is oriented to person, place, and time and well-developed, well-nourished, and in no distress.  HENT:  Head: Normocephalic and atraumatic.  Right Ear: External ear normal.  Left Ear: External ear normal.  Nose: Nose normal.  Mouth/Throat: Oropharynx is clear and moist. No oropharyngeal exudate.  Eyes: Conjunctivae and EOM are normal. Pupils are equal, round, and reactive to light.  Neck:  Neck supple. No thyromegaly present.  Cardiovascular: Normal rate, regular rhythm, normal heart sounds and intact distal pulses.   Pulmonary/Chest: Effort normal and breath sounds normal. No respiratory distress. He has no wheezes. He has no rales. He exhibits no tenderness.  Abdominal: Soft. Bowel sounds are normal. He exhibits no distension and no mass. There is no tenderness. There is no rebound and no guarding.  Genitourinary: Testes/scrotum normal.  Defers DRE  Lymphadenopathy:    He has no cervical adenopathy.  Neurological: He is alert and  oriented to person, place, and time.  Skin: Skin is warm and dry. No rash noted.  Psychiatric: Affect normal.  Vitals reviewed.  Recent Results (from the past 2160 hour(s))  CBC     Status: Abnormal   Collection Time: 06/20/15  8:42 AM  Result Value Ref Range   WBC 5.0 4.0 - 10.5 K/uL   RBC 6.65 (H) 4.22 - 5.81 Mil/uL   Platelets 224.0 150.0 - 400.0 K/uL   Hemoglobin 12.6 (L) 13.0 - 17.0 g/dL   HCT 39.7 39.0 - 52.0 %   MCV 59.7 Repeated and verified X2. (L) 78.0 - 100.0 fl   MCHC 31.7 30.0 - 36.0 g/dL   RDW 17.8 (H) 11.5 - 15.5 %  Comp Met (CMET)     Status: Abnormal   Collection Time: 06/20/15  8:42 AM  Result Value Ref Range   Sodium 139 135 - 145 mEq/L   Potassium 4.0 3.5 - 5.1 mEq/L   Chloride 103 96 - 112 mEq/L   CO2 30 19 - 32 mEq/L   Glucose, Bld 96 70 - 99 mg/dL   BUN 13 6 - 23 mg/dL   Creatinine, Ser 0.92 0.40 - 1.50 mg/dL   Total Bilirubin 2.3 (H) 0.2 - 1.2 mg/dL   Alkaline Phosphatase 45 39 - 117 U/L   AST 21 0 - 37 U/L   ALT 34 0 - 53 U/L   Total Protein 7.0 6.0 - 8.3 g/dL   Albumin 4.4 3.5 - 5.2 g/dL   Calcium 9.7 8.4 - 10.5 mg/dL   GFR 90.52 >60.00 mL/min  TSH     Status: None   Collection Time: 06/20/15  8:42 AM  Result Value Ref Range   TSH 0.77 0.35 - 4.50 uIU/mL  Hemoglobin A1c     Status: None   Collection Time: 06/20/15  8:42 AM  Result Value Ref Range   Hgb A1c MFr Bld 5.3 4.6 - 6.5 %    Comment: Glycemic Control Guidelines for People with Diabetes:Non Diabetic:  <6%Goal of Therapy: <7%Additional Action Suggested:  >8%   Urinalysis, Routine w reflex microscopic     Status: Abnormal   Collection Time: 06/20/15  8:42 AM  Result Value Ref Range   Color, Urine YELLOW Yellow;Lt. Yellow   APPearance CLEAR Clear   Specific Gravity, Urine <=1.005 (A) 1.000 - 1.030   pH 6.0 5.0 - 8.0   Total Protein, Urine NEGATIVE Negative   Urine Glucose NEGATIVE Negative   Ketones, ur NEGATIVE Negative   Bilirubin Urine NEGATIVE Negative   Hgb urine dipstick  NEGATIVE Negative   Urobilinogen, UA 0.2 0.0 - 1.0   Leukocytes, UA NEGATIVE Negative   Nitrite NEGATIVE Negative   WBC, UA none seen 0-2/hpf   RBC / HPF none seen 0-2/hpf   Squamous Epithelial / LPF Rare(0-4/hpf) Rare(0-4/hpf)  Lipid Profile     Status: Abnormal   Collection Time: 06/20/15  8:42 AM  Result Value Ref Range   Cholesterol  220 (H) 0 - 200 mg/dL    Comment: ATP III Classification       Desirable:  < 200 mg/dL               Borderline High:  200 - 239 mg/dL          High:  > = 240 mg/dL   Triglycerides 85.0 0.0 - 149.0 mg/dL    Comment: Normal:  <150 mg/dLBorderline High:  150 - 199 mg/dL   HDL 51.10 >39.00 mg/dL   VLDL 17.0 0.0 - 40.0 mg/dL   LDL Cholesterol 152 (H) 0 - 99 mg/dL   Total CHOL/HDL Ratio 4     Comment:                Men          Women1/2 Average Risk     3.4          3.3Average Risk          5.0          4.42X Average Risk          9.6          7.13X Average Risk          15.0          11.0                       NonHDL 168.80     Comment: NOTE:  Non-HDL goal should be 30 mg/dL higher than patient's LDL goal (i.e. LDL goal of < 70 mg/dL, would have non-HDL goal of < 100 mg/dL)  PSA     Status: None   Collection Time: 06/20/15  8:42 AM  Result Value Ref Range   PSA 0.91 0.10 - 4.00 ng/mL    Assessment/Plan: Prostate cancer screening Asymptomatic. Defers DRE. Will proceed with yearly PSA.  Visit for preventive health examination Depression screen negative. Health Maintenance reviewed -- Immunizations and colonoscopy up-to-date. Preventive schedule discussed and handout given in AVS. Will obtain fasting labs today.

## 2015-06-20 NOTE — Assessment & Plan Note (Signed)
Asymptomatic. Defers DRE. Will proceed with yearly PSA.

## 2015-06-20 NOTE — Progress Notes (Signed)
Pre visit review using our clinic review tool, if applicable. No additional management support is needed unless otherwise documented below in the visit note. 

## 2015-06-22 ENCOUNTER — Telehealth: Payer: Self-pay | Admitting: *Deleted

## 2015-06-22 ENCOUNTER — Encounter: Payer: Self-pay | Admitting: Physician Assistant

## 2015-06-22 DIAGNOSIS — R17 Unspecified jaundice: Secondary | ICD-10-CM

## 2015-06-22 NOTE — Telephone Encounter (Signed)
Called and spoke with the pt and informed him of recent lab results and note.  Pt verbalized understanding and agreed to the GI referral.  Referral placed and sent.//AB/CMA

## 2015-06-22 NOTE — Telephone Encounter (Signed)
-----   Message from Waldon Merl, PA-C sent at 06/21/2015  7:49 PM EST ----- Labs good overall. CBC coincides with history of thalassemia trait. Liver panel shows elevated bilirubin, more elevated than previously noted. I would like for him to see a GI specialist for assessment. Ok to place referral if he is willing. Cholesterol has risen since last year. He is right under the mark where I would treat. Want him to limit foods high in cholesterol and saturated fats. Increase aerobic exercise.

## 2015-07-04 ENCOUNTER — Ambulatory Visit (INDEPENDENT_AMBULATORY_CARE_PROVIDER_SITE_OTHER): Payer: BLUE CROSS/BLUE SHIELD | Admitting: Physician Assistant

## 2015-07-04 ENCOUNTER — Encounter: Payer: Self-pay | Admitting: Physician Assistant

## 2015-07-04 VITALS — BP 130/70 | HR 62 | Ht 70.0 in | Wt 184.0 lb

## 2015-07-04 DIAGNOSIS — R17 Unspecified jaundice: Secondary | ICD-10-CM

## 2015-07-04 DIAGNOSIS — K824 Cholesterolosis of gallbladder: Secondary | ICD-10-CM | POA: Diagnosis not present

## 2015-07-04 NOTE — Progress Notes (Signed)
Agree with assessment and plan. 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 Agree with f/u US.

## 2015-07-04 NOTE — Patient Instructions (Signed)
You have been scheduled [facility] an abdominal [facility] on 07/14/15 at 8 am. Please arrive 15 minutes prior to your appointment for registration. Make certain not to have anything to eat or drink 6 hours prior to your appointment. Should you need to reschedule your appointment, please contact radiology at [phone]. This test typically takes about 30 minutes to perform.

## 2015-07-04 NOTE — Progress Notes (Signed)
Patient ID: Howard Hicks, male   DOB: 1960-05-27, 55 y.o.   MRN: [id number]   Subjective:    Patient ID: Howard Hicks, male    DOB: Dec 18, 1960, 55 y.o.   MRN: [id number]  HPI  Howard Hicks is a pleasant 55 year old white male, new to GI today referred by Malva Cogan PA-C for evaluation of an elevated bilirubin. Patient has history of alpha thalassemia trait. He has had prior GI evaluation with digestive health in [place] and had colonoscopy in 2011 which was normal and an EGD in 2015 per Dr. Wynelle Beckmann with grade a esophagitis and was noted to have one sessile polyp in the stomach. Recent labs done 06/19/2014 total bilirubin 2.3 LFTs otherwise unremarkable. Review of his labs show that he has always had an elevated total bili and hepatic panel done in 2014 showed total bilirubin of 2.7 direct bili 0.5, and Indirect bili 2.2. Abdominal ultrasound was done in 2015 which showed one possible gallstone and several small gallbladder polyps largest measuring 4 mm, liver unremarkable. Patient is currently asymptomatic. He says his bilirubin has been elevated for as long as he can remember and has generally been attributed to the thalassemia. He says whenever he gets run down or if he is ill he will tend to become a little bit jaundiced.  Review of Systems Pertinent positive and negative review of systems were noted in the above HPI section.  All other review of systems was otherwise negative.  No outpatient encounter prescriptions on file as of 07/04/2015.   No facility-administered encounter medications on file as of 07/04/2015.   No Known Allergies Patient Active Problem List   Diagnosis Date Noted  . Visit for preventive health examination 06/20/2015  . Prostate cancer screening 06/20/2015  . Elevated bilirubin 01/06/2013  . History of herniated intervertebral disc 12/18/2012  . Alpha thalassemia trait 12/18/2012   Social History   Social History  . Marital Status: Divorced    Spouse Name:  N/A  . Number of Children: N/A  . Years of Education: N/A   Occupational History  . Not on file.   Social History Main Topics  . Smoking status: Former Smoker    Types: Cigars  . Smokeless tobacco: Never Used     Comment: Cigars Only Socially.  . Alcohol Use: 0.0 oz/week    0 Standard drinks or equivalent per week     Comment: rare  . Drug Use: No  . Sexual Activity: Yes    Birth Control/ Protection: None   Other Topics Concern  . Not on file   Social History Narrative   Pt Divorced, 2 children, 1 grandchild   Occupation: Museum/gallery conservator: [employer]   Hobbies: rock climbing          Howard Hicks's family history includes Anemia in his brother and mother; Colon polyps in his father; Crohn's disease in his paternal uncle; Dementia in his father and paternal grandmother; Diabetes in his maternal grandmother; Healthy in his sister; Hyperlipidemia in his father; Osteoporosis in his mother; Thyroid disease in his father. There is no history of Colon cancer.      Objective:    Filed Vitals:   07/04/15 1056  BP: 130/70  Pulse: 62    Physical Exam  well-developed white male in no acute distress, pleasant blood pressure 130/70 pulse 62 height 5 foot 10 weight 184. 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 Patient will be established with Dr. Adela LankArmbruster.   Amy Oswald HillockS [place] PA-C 07/04/2015   Cc: [name], [name], PA-C

## 2015-07-14 ENCOUNTER — Ambulatory Visit (HOSPITAL_COMMUNITY)
Admission: RE | Admit: 2015-07-14 | Discharge: 2015-07-14 | Disposition: A | Discharge status: Home / Self Care | Payer: BLUE CROSS/BLUE SHIELD | Source: Ambulatory Visit | Attending: Physician Assistant | Admitting: Physician Assistant

## 2015-07-14 DIAGNOSIS — K824 Cholesterolosis of gallbladder: Secondary | ICD-10-CM | POA: Diagnosis present

## 2015-07-14 DIAGNOSIS — R17 Unspecified jaundice: Secondary | ICD-10-CM | POA: Insufficient documentation

## 2016-05-15 IMAGING — US US ABDOMEN COMPLETE
1 series · 13 of 25 positions shown · non-contrast
Comparison: Abdominal ultrasound June 29, 2013

CLINICAL DATA: Elevated bili Rubin ; history of gallbladder polyps.

EXAM:
ABDOMEN ULTRASOUND COMPLETE

[Series 1: us abdomen complete · 0.22mm/px · 13 of 128 slices shown]
[im 1/128]
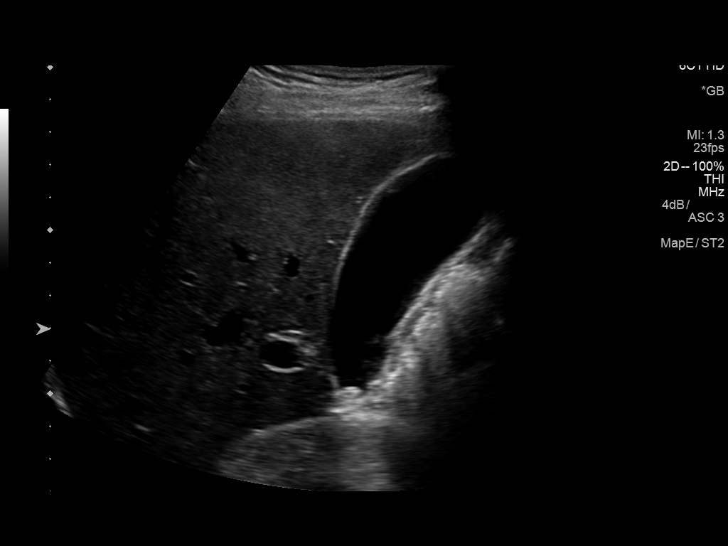
[im 11/128]
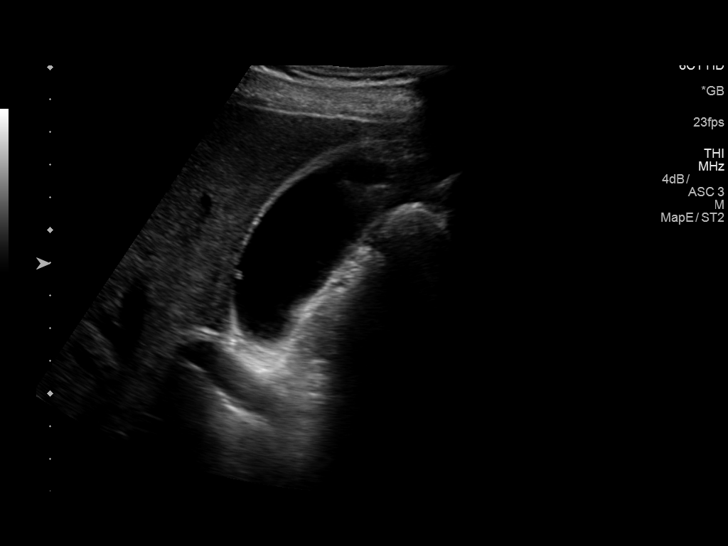
[im 22/128]
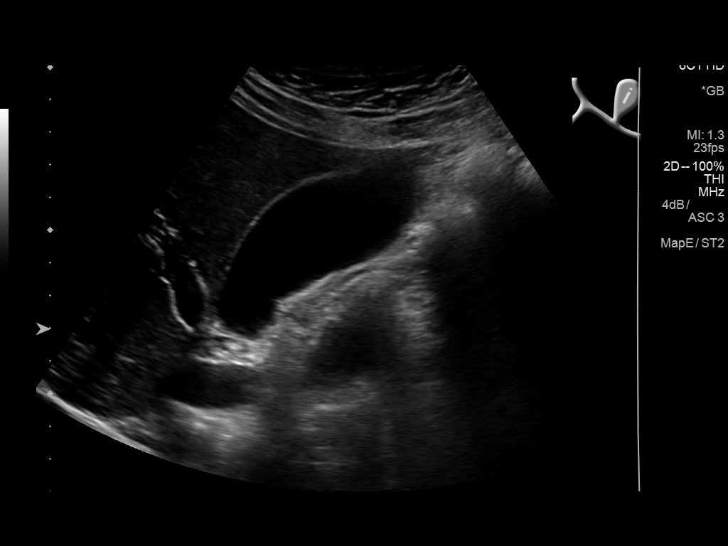
[im 32/128]
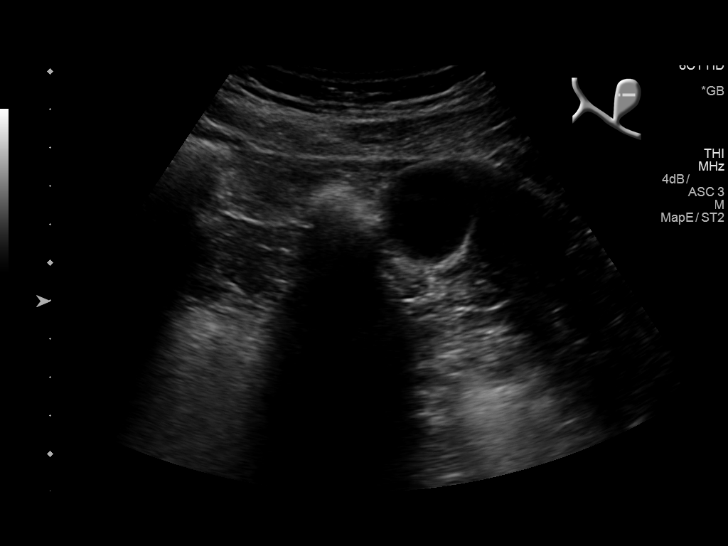
[im 43/128]
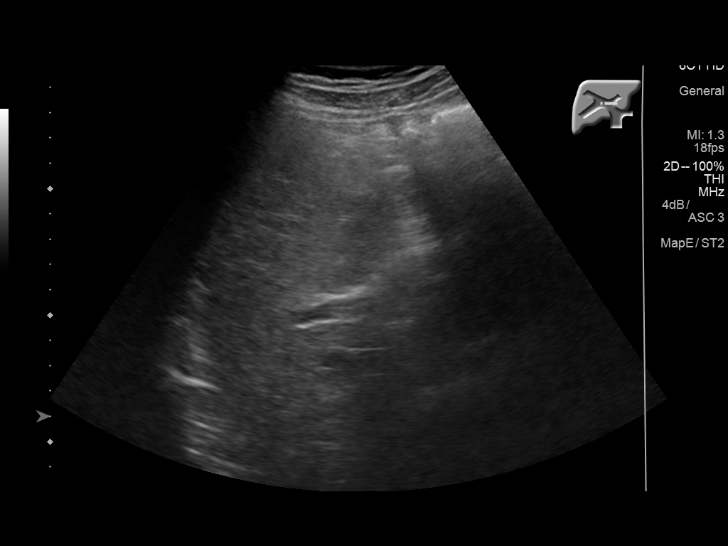
[im 53/128]
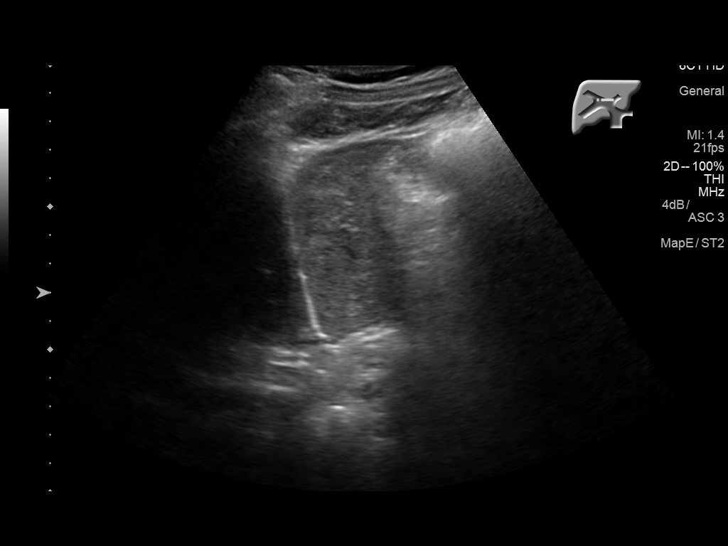
[im 64/128]
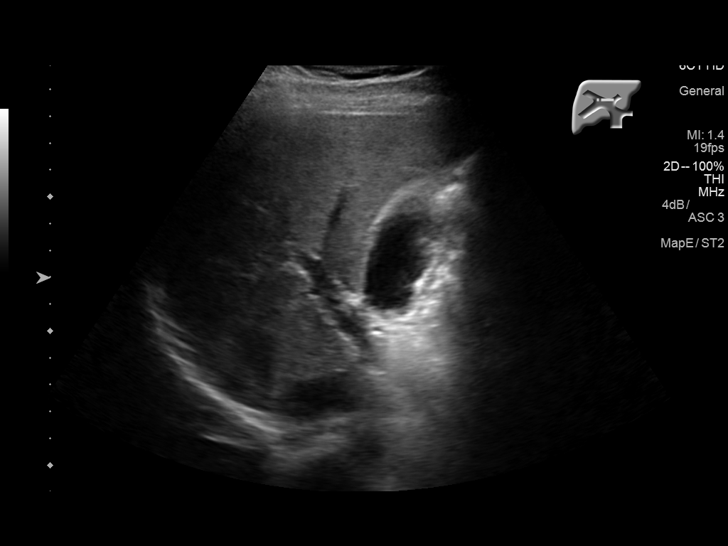
[im 75/128]
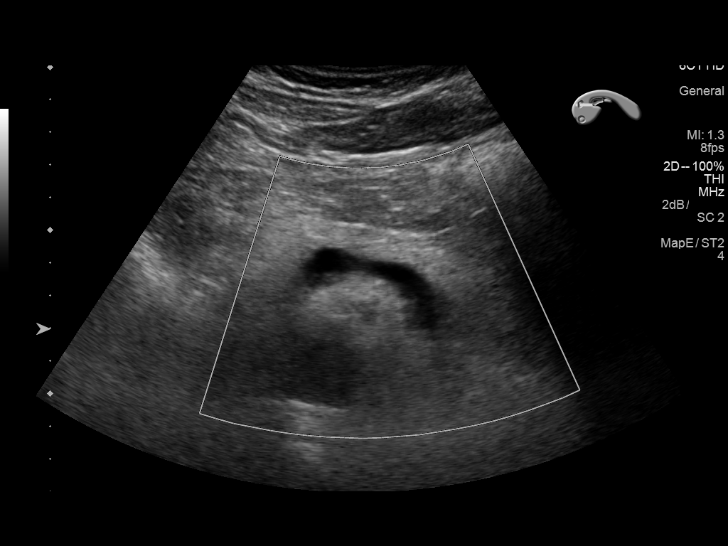
[im 85/128]
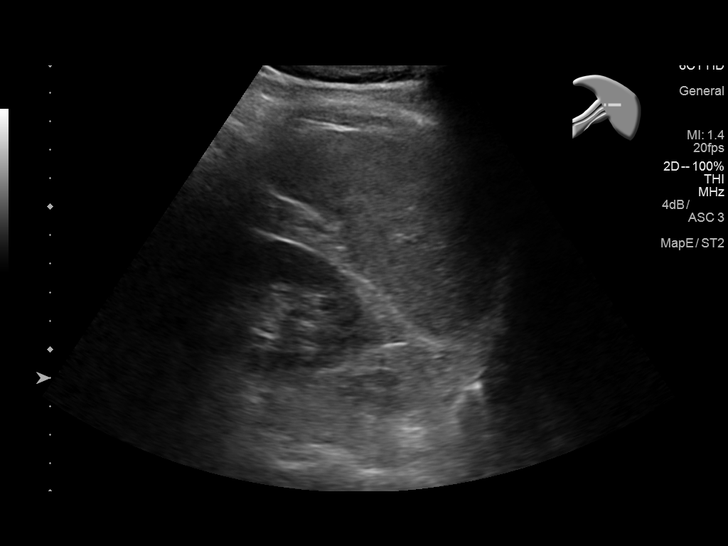
[im 96/128]
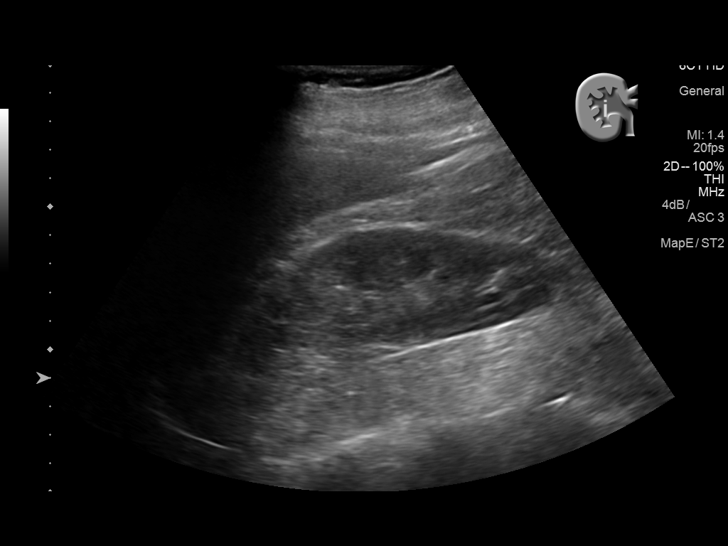
[im 106/128]
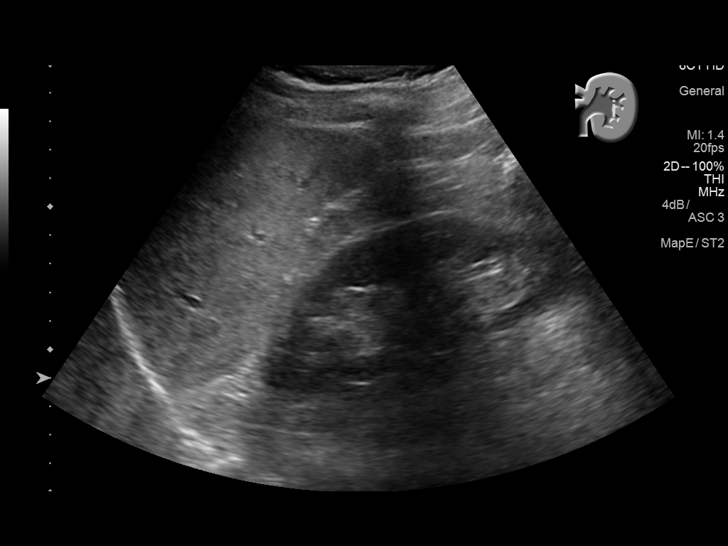
[im 117/128]
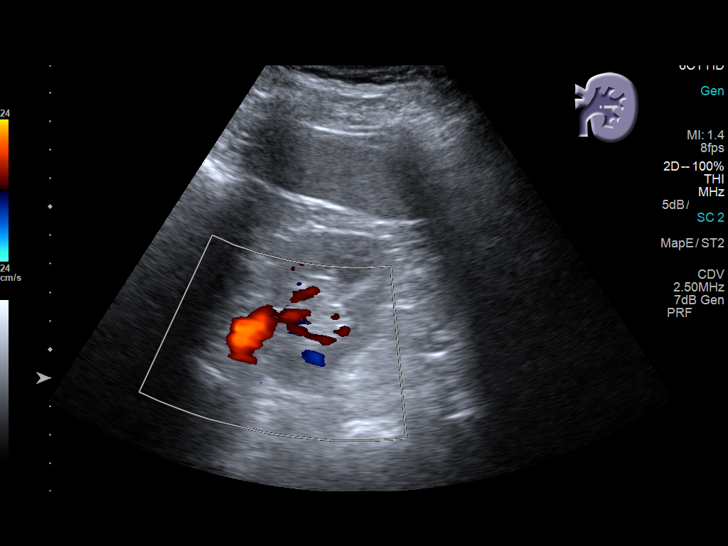
[im 128/128]
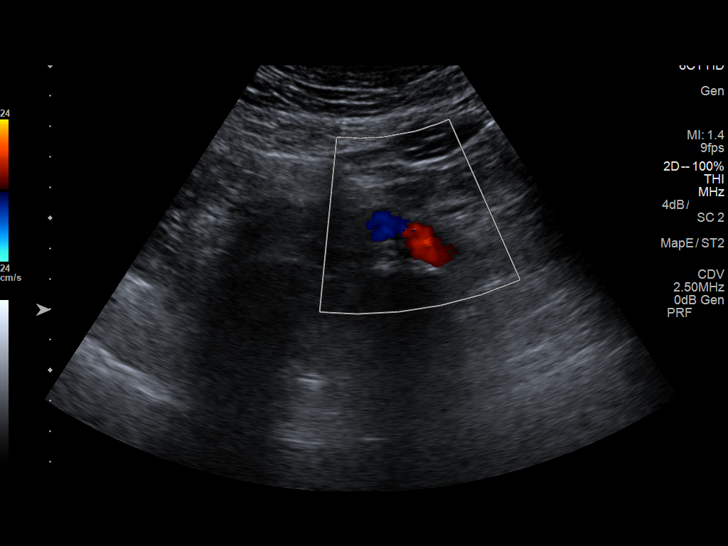

[13 of 25 positions shown; findings below may reference images not displayed]

FINDINGS: Gallbladder: The gallbladder is adequately distended. There it
echogenic non mobile non shadowing foci adherent to the gallbladder
mucosa. The largest measures approximately 5 mm in diameter. There
is no gallbladder wall thickening, pericholecystic fluid, or
positive sonographic Murphy's sign.

Common bile duct: Diameter: 4 mm

Liver: No focal lesion identified. Within normal limits in
parenchymal echogenicity.

IVC: No abnormality visualized.

Pancreas: The pancreatic head and body are within the limits of
normal. The pancreatic tail is partially obscured by bowel gas.

Spleen: Size and appearance within normal limits.

Right Kidney: Length: 12.0 cm. Echogenicity within normal limits. No
mass or hydronephrosis visualized.

Left Kidney: Length: 11.4 cm. Echogenicity within normal limits. No
mass or hydronephrosis visualized.

Abdominal aorta: No aneurysm visualized.

Other findings: There is no ascites.
IMPRESSION: 1. Again demonstrated are gallbladder polyps. No echogenic mobile
shadowing stones are observed. There is no sonographic evidence of
acute cholecystitis.
2. The common bile duct, liver, and visualized portions of the
pancreas are normal. The pancreatic tail was obscured by bowel gas.
There is no splenomegaly.
3. No abnormality is observed elsewhere within the abdomen.

## 2017-04-17 DIAGNOSIS — J111 Influenza due to unidentified influenza virus with other respiratory manifestations: Secondary | ICD-10-CM | POA: Diagnosis not present

## 2017-04-17 DIAGNOSIS — M791 Myalgia, unspecified site: Secondary | ICD-10-CM | POA: Diagnosis not present

## 2017-04-17 DIAGNOSIS — J1189 Influenza due to unidentified influenza virus with other manifestations: Secondary | ICD-10-CM | POA: Diagnosis not present

## 2017-09-09 ENCOUNTER — Telehealth: Payer: Self-pay | Admitting: Emergency Medicine

## 2017-09-09 ENCOUNTER — Encounter: Payer: Self-pay | Admitting: Emergency Medicine

## 2017-09-09 NOTE — Telephone Encounter (Signed)
error 

## 2017-09-09 NOTE — Telephone Encounter (Signed)
Ok with me 

## 2017-09-09 NOTE — Telephone Encounter (Signed)
Patient would like to transfer care to provider at [facility] location. Is this ok with you?

## 2017-11-06 ENCOUNTER — Encounter: Payer: Self-pay | Admitting: Family Medicine

## 2017-11-06 ENCOUNTER — Ambulatory Visit (INDEPENDENT_AMBULATORY_CARE_PROVIDER_SITE_OTHER): Payer: BLUE CROSS/BLUE SHIELD | Admitting: Family Medicine

## 2017-11-06 VITALS — BP 112/70 | HR 52 | Temp 98.0°F | Ht 70.0 in | Wt 182.2 lb

## 2017-11-06 DIAGNOSIS — Z23 Encounter for immunization: Secondary | ICD-10-CM

## 2017-11-06 DIAGNOSIS — Z0000 Encounter for general adult medical examination without abnormal findings: Secondary | ICD-10-CM | POA: Diagnosis not present

## 2017-11-06 DIAGNOSIS — Z125 Encounter for screening for malignant neoplasm of prostate: Secondary | ICD-10-CM

## 2017-11-06 LAB — LIPID PANEL
CHOLESTEROL: 203 mg/dL — AB (ref 0–200)
HDL: 57.6 mg/dL (ref >39.00)
LDL Cholesterol: 133 mg/dL — ABNORMAL HIGH (ref 0–99)
NonHDL: 145.43
Total CHOL/HDL Ratio: 4
Triglycerides: 63 mg/dL (ref 0.0–149.0)
VLDL: 12.6 mg/dL (ref 0.0–40.0)

## 2017-11-06 LAB — COMPREHENSIVE METABOLIC PANEL
ALBUMIN: 4.1 g/dL (ref 3.5–5.2)
ALK PHOS: 41 U/L (ref 39–117)
ALT: 34 U/L (ref 0–53)
AST: 23 U/L (ref 0–37)
BUN: 17 mg/dL (ref 6–23)
CO2: 29 mEq/L (ref 19–32)
Calcium: 9 mg/dL (ref 8.4–10.5)
Chloride: 103 mEq/L (ref 96–112)
Creatinine, Ser: 1.01 mg/dL (ref 0.40–1.50)
GFR: 80.59 mL/min (ref >60.00)
Glucose, Bld: 95 mg/dL (ref 70–99)
Potassium: 4.2 mEq/L (ref 3.5–5.1)
SODIUM: 138 meq/L (ref 135–145)
TOTAL PROTEIN: 6.8 g/dL (ref 6.0–8.3)
Total Bilirubin: 1.3 mg/dL — ABNORMAL HIGH (ref 0.2–1.2)

## 2017-11-06 LAB — CBC
HEMATOCRIT: 38 % — AB (ref 39.0–52.0)
HEMOGLOBIN: 12.1 g/dL — AB (ref 13.0–17.0)
MCHC: 31.8 g/dL (ref 30.0–36.0)
MCV: 58.2 fl — ABNORMAL LOW (ref 78.0–100.0)
Platelets: 178 10*3/uL (ref 150.0–400.0)
RBC: 6.52 Mil/uL — AB (ref 4.22–5.81)
RDW: 18.5 % — ABNORMAL HIGH (ref 11.5–15.5)
WBC: 4.2 10*3/uL (ref 4.0–10.5)

## 2017-11-06 LAB — PSA: PSA: 0.96 ng/mL (ref 0.10–4.00)

## 2017-11-06 NOTE — Patient Instructions (Addendum)
Call your pharmacy to see about the availability of the new shingles vaccine (Shingrix). It will make you feel crummy for 48 hours following injection so plan accordingly.  Keep up the good work.  Aim to do some physical exertion for 150 minutes per week. This is typically divided into 5 days per week, 30 minutes per day. The activity should be enough to get your heart rate up. Anything is better than nothing if you have time constraints.  1-2 days to get the results of your labs back.  Let us know if you need anything.

## 2017-11-06 NOTE — Progress Notes (Signed)
Pre visit review using our clinic review tool, if applicable. No additional management support is needed unless otherwise documented below in the visit note. 

## 2017-11-06 NOTE — Progress Notes (Signed)
Chief Complaint  Patient presents with  . New Patient (Initial Visit)    [name] is here for a complete physical.   His last physical was >1 year ago.  Current diet: in general, a "healthy" diet.  Current exercise: Intermittent elliptical  Weight trend: stable Does pt snore? Occasionally, no apneic episodes Daytime fatigue? No. Seat belt? Yes.    Health maintenance Shingrix- No Colonoscopy- Yes Tetanus- No HIV- Yes Hep C- Yes - 15 yrs Prostate cancer screening- Yes   Past Medical History:  Diagnosis Date  . Alpha thalassemia trait   . Anemia   . Chicken pox   . H/O removal of thyroglossal duct cyst   . Hyperlipidemia       Past Surgical History:  Procedure Laterality Date  . THYROID CYST EXCISION  1975    Medications  Takes no medications routinely.   Allergies No Known Allergies  Family History Family History  Problem Relation Age of Onset  . Hyperlipidemia Father   . Dementia Father   . Anemia Mother   . Dementia Paternal Grandmother   . Diabetes Maternal Grandmother   . Crohn's disease Paternal Uncle   . Anemia Brother   . Healthy Sister        x2  . Colon cancer Neg Hx   . Osteoporosis Mother   . Thyroid disease Father   . Colon polyps Father     Review of Systems: Constitutional:  no fevers Eye:  no recent significant change in vision Ear/Nose/Mouth/Throat:  Ears:  no hearing loss Nose/Mouth/Throat:  no complaints of nasal congestion, no sore throat Cardiovascular:  no chest pain, no palpitations Respiratory:  no cough and no shortness of breath Gastrointestinal:  no abdominal pain, no change in bowel habits GU:  Male: negative for dysuria, frequency, and incontinence and negative for prostate symptoms Musculoskeletal/Extremities:  no pain, redness, or swelling of the joints Integumentary (Skin/Breast): One area on L forearm he would like looked at; otherwise no abnormal skin lesions reported Neurologic:  no  headaches Endocrine: No unexpected weight changes Hematologic/Lymphatic:  no abnormal bleeding  Exam BP 112/70 (BP Location: Left Arm, Patient Position: Sitting, Cuff Size: Normal)   Pulse (!) 52   Temp 98 F (36.7 C) (Oral)   Ht 5\' 10"  (1.778 m)   Wt 182 lb 4 oz (82.7 kg)   SpO2 98%   BMI 26.15 kg/m  General:  well developed, well nourished, in no apparent distress Skin: See below; otherwise no significant moles, warts, or growths Head:  no masses, lesions, or tenderness Eyes:  pupils equal and round, sclera anicteric without injection Ears:  canals without lesions, TMs shiny without retraction, no obvious effusion, no erythema Nose:  nares patent, septum midline, mucosa normal Throat/Pharynx:  lips and gingiva without lesion; tongue and uvula midline; non-inflamed pharynx; no exudates or postnasal drainage Neck: neck supple without adenopathy, thyromegaly, or masses Lungs:  clear to auscultation, breath sounds equal bilaterally, no respiratory distress Cardio:  regular rate and rhythm, no LE edema, no bruits Abdomen:  abdomen soft, nontender; bowel sounds normal; no masses or organomegaly Genital (male): circumcised penis, no lesions or discharge; testes present bilaterally without masses or tenderness Rectal: Deferred Musculoskeletal:  symmetrical muscle groups noted without atrophy or deformity Extremities:  no clubbing, cyanosis, or edema, no deformities, no skin discoloration Neuro:  gait normal; deep tendon reflexes normal and symmetric Psych: well oriented with normal range of affect and appropriate judgment/insight   L forearm  Assessment and Plan  Well adult exam - Plan: CBC, Comprehensive metabolic panel, Lipid panel  Screening for prostate cancer - Plan: PSA  Need for tetanus booster - Plan: Tdap vaccine greater than or equal to 7yo IM   Well 57 y.o. male. Counseled on diet and exercise. Counseled on risks and benefits of prostate cancer screening with PSA.  The patient agrees to undergo testing. Will keep an eye on skin lesion. If it changes, he will let us know. Looks like SK.  Immunizations, labs, and further orders as above. Follow up in 1 yr or prn. The patient voiced understanding and agreement to the plan.  Jilda Roche [place], DO 11/06/17 9:32 AM

## 2018-11-10 ENCOUNTER — Encounter: Payer: Self-pay | Admitting: Family Medicine

## 2018-11-10 ENCOUNTER — Other Ambulatory Visit: Payer: Self-pay

## 2018-11-10 ENCOUNTER — Ambulatory Visit (INDEPENDENT_AMBULATORY_CARE_PROVIDER_SITE_OTHER): Payer: Commercial Managed Care - PPO | Admitting: Family Medicine

## 2018-11-10 VITALS — BP 120/70 | HR 63 | Temp 98.6°F | Ht 70.5 in | Wt 179.1 lb

## 2018-11-10 DIAGNOSIS — Z0000 Encounter for general adult medical examination without abnormal findings: Secondary | ICD-10-CM

## 2018-11-10 DIAGNOSIS — Z125 Encounter for screening for malignant neoplasm of prostate: Secondary | ICD-10-CM | POA: Diagnosis not present

## 2018-11-10 LAB — COMPREHENSIVE METABOLIC PANEL
ALT: 20 U/L (ref 0–53)
AST: 16 U/L (ref 0–37)
Albumin: 4.4 g/dL (ref 3.5–5.2)
Alkaline Phosphatase: 41 U/L (ref 39–117)
BUN: 13 mg/dL (ref 6–23)
CO2: 28 mEq/L (ref 19–32)
Calcium: 9.1 mg/dL (ref 8.4–10.5)
Chloride: 105 mEq/L (ref 96–112)
Creatinine, Ser: 0.85 mg/dL (ref 0.40–1.50)
GFR: 92.2 mL/min (ref >60.00)
Glucose, Bld: 96 mg/dL (ref 70–99)
Potassium: 4 mEq/L (ref 3.5–5.1)
Sodium: 140 mEq/L (ref 135–145)
Total Bilirubin: 1.8 mg/dL — ABNORMAL HIGH (ref 0.2–1.2)
Total Protein: 6.7 g/dL (ref 6.0–8.3)

## 2018-11-10 LAB — CBC
HCT: 37 % — ABNORMAL LOW (ref 39.0–52.0)
Hemoglobin: 11.5 g/dL — ABNORMAL LOW (ref 13.0–17.0)
MCHC: 31.1 g/dL (ref 30.0–36.0)
MCV: 60.1 fl — ABNORMAL LOW (ref 78.0–100.0)
Platelets: 194 10*3/uL (ref 150.0–400.0)
RBC: 6.16 Mil/uL — ABNORMAL HIGH (ref 4.22–5.81)
RDW: 17.8 % — ABNORMAL HIGH (ref 11.5–15.5)
WBC: 5.7 10*3/uL (ref 4.0–10.5)

## 2018-11-10 LAB — LIPID PANEL
Cholesterol: 191 mg/dL (ref 0–200)
HDL: 50.7 mg/dL (ref >39.00)
LDL Cholesterol: 127 mg/dL — ABNORMAL HIGH (ref 0–99)
NonHDL: 140.6
Total CHOL/HDL Ratio: 4
Triglycerides: 69 mg/dL (ref 0.0–149.0)
VLDL: 13.8 mg/dL (ref 0.0–40.0)

## 2018-11-10 LAB — PSA: PSA: 1 ng/mL (ref 0.10–4.00)

## 2018-11-10 NOTE — Patient Instructions (Addendum)

## 2018-11-10 NOTE — Progress Notes (Signed)
Chief Complaint  Patient presents with  . Annual Exam    [name] is here for a complete physical.   His last physical was >1 year ago.  Current diet: in general, a "healthy" diet.  Current exercise: elliptical machine around 3x/week Weight trend: stable Does pt snore? No. Daytime fatigue? No. Seat belt? Yes.    Health maintenance Shingrix- No Colonoscopy- Yes Tetanus- Yes HIV- Yes Hep C- Yes   Past Medical History:  Diagnosis Date  . Alpha thalassemia trait   . Anemia   . Chicken pox   . H/O removal of thyroglossal duct cyst   . Hyperlipidemia       Past Surgical History:  Procedure Laterality Date  . THYROID CYST EXCISION  1975    Medications  Takes no meds routinely.    Allergies No Known Allergies  Family History Family History  Problem Relation Age of Onset  . Hyperlipidemia Father   . Dementia Father   . Anemia Mother   . Dementia Paternal Grandmother   . Diabetes Maternal Grandmother   . Crohn's disease Paternal Uncle   . Anemia Brother   . Healthy Sister        x2  . Colon cancer Neg Hx   . Osteoporosis Mother   . Thyroid disease Father   . Colon polyps Father     Review of Systems: Constitutional:  no fevers Eye:  no recent significant change in vision Ear/Nose/Mouth/Throat:  Ears:  no hearing loss Nose/Mouth/Throat:  no complaints of nasal congestion, no sore throat Cardiovascular:  no chest pain, no palpitations Respiratory:  no cough and no shortness of breath Gastrointestinal:  no abdominal pain, no change in bowel habits GU:  Male: negative for dysuria, frequency, and incontinence and negative for prostate symptoms Musculoskeletal/Extremities:  no pain, redness, or swelling of the joints Integumentary (Skin/Breast):  no abnormal skin lesions reported Neurologic:  no headaches Endocrine: No unexpected weight changes Hematologic/Lymphatic:  no abnormal bleeding  Exam BP 120/70 (BP Location: Left Arm, Patient  Position: Sitting, Cuff Size: Normal)   Pulse 63   Temp 98.6 F (37 C) (Oral)   Ht 5' 10.5" (1.791 m)   Wt 179 lb 2 oz (81.3 kg)   SpO2 98%   BMI 25.34 kg/m  General:  well developed, well nourished, in no apparent distress Skin:  no significant moles, warts, or growths Head:  no masses, lesions, or tenderness Eyes:  pupils equal and round, sclera anicteric without injection Ears:  canals without lesions, TMs shiny without retraction, no obvious effusion, no erythema Nose:  nares patent, septum midline, mucosa normal Throat/Pharynx:  lips and gingiva without lesion; tongue and uvula midline; non-inflamed pharynx; no exudates or postnasal drainage Neck: neck supple without adenopathy, thyromegaly, or masses Lungs:  clear to auscultation, breath sounds equal bilaterally, no respiratory distress Cardio:  regular rate and rhythm, no LE edema, no bruits Abdomen:  abdomen soft, nontender; bowel sounds normal; no masses or organomegaly Rectal: Deferred Musculoskeletal:  symmetrical muscle groups noted without atrophy or deformity Extremities:  no clubbing, cyanosis, or edema, no deformities, no skin discoloration Neuro:  gait normal; deep tendon reflexes normal and symmetric Psych: well oriented with normal range of affect and appropriate judgment/insight  Assessment and Plan  Well adult exam - Plan: CBC, Comprehensive metabolic panel, Lipid panel  Screening for prostate cancer - Plan: PSA   Well 58 y.o. male. Counseled on diet and exercise. Counseled on risks and benefits of prostate cancer  screening with PSA. The patient agrees to undergo testing. Immunizations, labs, and further orders as above. Discussed Shingrix, gave info in avs.  Follow up in 1 yr or prn. The patient voiced understanding and agreement to the plan.  [place], DO 11/10/18 7:44 AM

## 2019-11-10 ENCOUNTER — Encounter: Payer: Commercial Managed Care - PPO | Admitting: Family Medicine

## 2019-11-11 ENCOUNTER — Other Ambulatory Visit: Payer: Self-pay

## 2019-11-11 ENCOUNTER — Ambulatory Visit (INDEPENDENT_AMBULATORY_CARE_PROVIDER_SITE_OTHER): Payer: Commercial Managed Care - PPO | Admitting: Family Medicine

## 2019-11-11 ENCOUNTER — Other Ambulatory Visit: Payer: Self-pay | Admitting: Family Medicine

## 2019-11-11 ENCOUNTER — Encounter: Payer: Self-pay | Admitting: Family Medicine

## 2019-11-11 ENCOUNTER — Other Ambulatory Visit (INDEPENDENT_AMBULATORY_CARE_PROVIDER_SITE_OTHER): Payer: Commercial Managed Care - PPO

## 2019-11-11 VITALS — BP 118/70 | HR 53 | Temp 98.4°F | Ht 70.5 in | Wt 186.2 lb

## 2019-11-11 DIAGNOSIS — Z0000 Encounter for general adult medical examination without abnormal findings: Secondary | ICD-10-CM

## 2019-11-11 DIAGNOSIS — Z125 Encounter for screening for malignant neoplasm of prostate: Secondary | ICD-10-CM | POA: Diagnosis not present

## 2019-11-11 DIAGNOSIS — Z1211 Encounter for screening for malignant neoplasm of colon: Secondary | ICD-10-CM | POA: Diagnosis not present

## 2019-11-11 DIAGNOSIS — R7989 Other specified abnormal findings of blood chemistry: Secondary | ICD-10-CM | POA: Diagnosis not present

## 2019-11-11 DIAGNOSIS — E785 Hyperlipidemia, unspecified: Secondary | ICD-10-CM

## 2019-11-11 LAB — COMPREHENSIVE METABOLIC PANEL
ALT: 35 U/L (ref 0–53)
AST: 22 U/L (ref 0–37)
Albumin: 4.2 g/dL (ref 3.5–5.2)
Alkaline Phosphatase: 38 U/L — ABNORMAL LOW (ref 39–117)
BUN: 13 mg/dL (ref 6–23)
CO2: 29 mEq/L (ref 19–32)
Calcium: 9.2 mg/dL (ref 8.4–10.5)
Chloride: 102 mEq/L (ref 96–112)
Creatinine, Ser: 0.87 mg/dL (ref 0.40–1.50)
GFR: 89.45 mL/min (ref >60.00)
Glucose, Bld: 96 mg/dL (ref 70–99)
Potassium: 3.9 mEq/L (ref 3.5–5.1)
Sodium: 137 mEq/L (ref 135–145)
Total Bilirubin: 2.2 mg/dL — ABNORMAL HIGH (ref 0.2–1.2)
Total Protein: 6.8 g/dL (ref 6.0–8.3)

## 2019-11-11 LAB — LIPID PANEL
Cholesterol: 214 mg/dL — ABNORMAL HIGH (ref 0–200)
HDL: 56.7 mg/dL (ref >39.00)
LDL Cholesterol: 144 mg/dL — ABNORMAL HIGH (ref 0–99)
NonHDL: 157.31
Total CHOL/HDL Ratio: 4
Triglycerides: 67 mg/dL (ref 0.0–149.0)
VLDL: 13.4 mg/dL (ref 0.0–40.0)

## 2019-11-11 LAB — IBC + FERRITIN
Ferritin: 129.4 ng/mL (ref 22.0–322.0)
Iron: 162 ug/dL (ref 42–165)
Saturation Ratios: 45.9 % (ref 20.0–50.0)
Transferrin: 252 mg/dL (ref 212.0–360.0)

## 2019-11-11 LAB — PSA: PSA: 1 ng/mL (ref 0.10–4.00)

## 2019-11-11 LAB — CBC
HCT: 35.6 % — ABNORMAL LOW (ref 39.0–52.0)
Hemoglobin: 11.3 g/dL — ABNORMAL LOW (ref 13.0–17.0)
MCHC: 31.8 g/dL (ref 30.0–36.0)
MCV: 59.2 fl — ABNORMAL LOW (ref 78.0–100.0)
Platelets: 169 10*3/uL (ref 150.0–400.0)
RBC: 6.01 Mil/uL — ABNORMAL HIGH (ref 4.22–5.81)
RDW: 17.9 % — ABNORMAL HIGH (ref 11.5–15.5)
WBC: 5.1 10*3/uL (ref 4.0–10.5)

## 2019-11-11 NOTE — Patient Instructions (Addendum)
Wear sunscreen/block if outside in sun for more than 10-15 minutes.  Give [place] 2-3 business days to get the results of your labs back.   Aim to do some physical exertion for 150 minutes per week. This is typically divided into 5 days per week, 30 minutes per day. The activity should be enough to get your heart rate up. Anything is better than nothing if you have time constraints.  Make healthy food choices.   The new Shingrix vaccine (for shingles) is a 2 shot series. It can make people feel low energy, achy and almost like they have the flu for 48 hours after injection. Please plan accordingly when deciding on when to get this shot. Call our office for a nurse visit appointment to get this. The second shot of the series is less severe regarding the side effects, but it still lasts 48 hours. Don't get the covid shot within 2 weeks of either Shingrix vaccination.   Let us know if you need anything.

## 2019-11-11 NOTE — Progress Notes (Signed)
Chief Complaint  Patient presents with   Annual Exam    [name] is here for a complete physical.   His last physical was >1 year ago.  Current diet: in general, diet is fair.  Current exercise: some walking Weight trend: stable Fatigue out of ordinary? No. Seat belt? Yes.     Health maintenance Shingrix- No Colonoscopy- No Tetanus- Yes HIV- Yes Hep C- Yes   Past Medical History:  Diagnosis Date   Alpha thalassemia trait    Anemia    Chicken pox    H/O removal of thyroglossal duct cyst    Hyperlipidemia       Past Surgical History:  Procedure Laterality Date   THYROID CYST EXCISION  1975    Medications  No current outpatient medications on file prior to visit.   No current facility-administered medications on file prior to visit.     Allergies No Known Allergies  Family History Family History  Problem Relation Age of Onset   Hyperlipidemia Father    Dementia Father    Thyroid disease Father    Colon polyps Father    Anemia Mother    Osteoporosis Mother    Dementia Paternal Grandmother    Diabetes Maternal Grandmother    Crohn's disease Paternal Uncle    Anemia Brother    Healthy Sister        x2   Colon cancer Neg Hx     Review of Systems: Constitutional:  no fevers Eye:  no recent significant change in vision Ear/Nose/Mouth/Throat:  Ears:  no hearing loss Nose/Mouth/Throat:  no complaints of nasal congestion, no sore throat Cardiovascular:  no chest pain Respiratory:  no shortness of breath Gastrointestinal:  no change in bowel habits GU:  Male: negative for dysuria, frequency Musculoskeletal/Extremities:  no joint pain Integumentary (Skin/Breast):  no abnormal skin lesions reported Neurologic:  no headaches Endocrine: No unexpected weight changes Hematologic/Lymphatic:  no abnormal bleeding  Exam BP 118/70 (BP Location: Left Arm, Patient Position: Sitting, Cuff Size: Normal)    Pulse (!) 53    Temp  98.4 F (36.9 C) (Oral)    Ht 5' 10.5" (1.791 m)    Wt 186 lb 4 oz (84.5 kg)    SpO2 98%    BMI 26.35 kg/m  General:  well developed, well nourished, in no apparent distress Skin:  no significant moles, warts, or growths Head:  no masses, lesions, or tenderness Eyes:  pupils equal and round, sclera anicteric without injection Ears:  canals without lesions, TMs shiny without retraction, no obvious effusion, no erythema Nose:  nares patent, septum midline, mucosa normal Throat/Pharynx:  lips and gingiva without lesion; tongue and uvula midline; non-inflamed pharynx; no exudates or postnasal drainage Neck: neck supple without adenopathy, thyromegaly, or masses Cardiac: RRR, no bruits, no LE edema Lungs:  clear to auscultation, breath sounds equal bilaterally, no respiratory distress Rectal: Deferred Musculoskeletal:  symmetrical muscle groups noted without atrophy or deformity Neuro:  gait normal; deep tendon reflexes normal and symmetric Psych: well oriented with normal range of affect and appropriate judgment/insight  Assessment and Plan  Well adult exam - Plan: CBC, Comprehensive metabolic panel, Lipid panel  Screen for colon cancer - Plan: Ambulatory referral to Gastroenterology  Screening for malignant neoplasm of prostate - Plan: PSA   Well 59 y.o. male. Counseled on diet and exercise. Should increase physical activity.  Counseled on risks and benefits of prostate cancer screening with PSA. The patient agrees to f/u testing.  Immunizations, labs, and further orders as above. Follow up in 1 yr or prn. The patient voiced understanding and agreement to the plan.  Jilda Roche [place], DO 11/11/19 7:15 AM

## 2019-11-12 ENCOUNTER — Encounter: Payer: Self-pay | Admitting: Gastroenterology

## 2019-11-12 LAB — CBC (INCLUDES DIFF/PLT) WITH PATHOLOGIST REVIEW
Absolute Monocytes: 482 cells/uL (ref 200–950)
Basophils Absolute: 58 cells/uL (ref 0–200)
Basophils Relative: 1.1 %
Eosinophils Absolute: 254 cells/uL (ref 15–500)
Eosinophils Relative: 4.8 %
HCT: 37.9 % — ABNORMAL LOW (ref 38.5–50.0)
Hemoglobin: 11.4 g/dL — ABNORMAL LOW (ref 13.2–17.1)
Lymphs Abs: 1495 cells/uL (ref 850–3900)
MCH: 18.7 pg — ABNORMAL LOW (ref 27.0–33.0)
MCHC: 30.1 g/dL — ABNORMAL LOW (ref 32.0–36.0)
MCV: 62 fL — ABNORMAL LOW (ref 80.0–100.0)
Monocytes Relative: 9.1 %
Neutro Abs: 3010 cells/uL (ref 1500–7800)
Neutrophils Relative %: 56.8 %
Platelets: 209 10*3/uL (ref 140–400)
RBC: 6.11 10*6/uL — ABNORMAL HIGH (ref 4.20–5.80)
RDW: 19.9 % — ABNORMAL HIGH (ref 11.0–15.0)
Total Lymphocyte: 28.2 %
WBC: 5.3 10*3/uL (ref 3.8–10.8)

## 2019-12-14 ENCOUNTER — Encounter: Payer: Self-pay | Admitting: Gastroenterology

## 2019-12-14 ENCOUNTER — Ambulatory Visit (AMBULATORY_SURGERY_CENTER): Payer: Self-pay

## 2019-12-14 ENCOUNTER — Other Ambulatory Visit: Payer: Self-pay

## 2019-12-14 VITALS — Ht 70.5 in | Wt 183.0 lb

## 2019-12-14 DIAGNOSIS — Z01818 Encounter for other preprocedural examination: Secondary | ICD-10-CM

## 2019-12-14 DIAGNOSIS — Z1211 Encounter for screening for malignant neoplasm of colon: Secondary | ICD-10-CM

## 2019-12-14 MED ORDER — PLENVU 140 G PO SOLR
1.0000 | ORAL | 0 refills | Status: DC
Start: 2019-12-14 — End: 2019-12-30

## 2019-12-14 NOTE — Progress Notes (Signed)
No egg or soy allergy known to patient  No issues with past sedation with any surgeries or procedures No intubation problems in the past  No FH of Malignant Hyperthermia No diet pills per patient No home 02 use per patient  No blood thinners per patient  Pt denies issues with constipation  No A fib or A flutter  EMMI video via MyChart  COVID 19 guidelines implemented in [facility] today with Pt and RN  Coupon given to pt in [facility] today , Code to Pharmacy  COVID screening scheduled on 12/25/2019 at 8:10 am- patient is aware of appt date/time;  Due to the COVID-19 pandemic we are asking patients to follow these guidelines. Please only bring one care partner. Please be aware that your care partner may wait in the car in the parking lot or if they feel like they will be too hot to wait in the car, they may wait in the lobby on the 4th floor. All care partners are required to wear a mask the entire time (we do not have any that we can provide them), they need to practice social distancing, and we will do a Covid check for all patient's and care partners when you arrive. Also we will check their temperature and your temperature. If the care partner waits in their car they need to stay in the parking lot the entire time and we will call them on their cell phone when the patient is ready for discharge so they can bring the car to the front of the building. Also all patient's will need to wear a mask into building.

## 2019-12-14 NOTE — Addendum Note (Signed)
Addended by: Johnney Killian on: 12/14/2019 10:53 AM   Modules accepted: Orders

## 2019-12-22 NOTE — Addendum Note (Signed)
Addended by: Lannie Yusuf A on: 12/22/2019 09:43 AM   Modules accepted: Orders  

## 2019-12-23 ENCOUNTER — Other Ambulatory Visit: Payer: Self-pay

## 2019-12-23 ENCOUNTER — Other Ambulatory Visit (INDEPENDENT_AMBULATORY_CARE_PROVIDER_SITE_OTHER): Payer: Commercial Managed Care - PPO

## 2019-12-23 DIAGNOSIS — E785 Hyperlipidemia, unspecified: Secondary | ICD-10-CM

## 2019-12-24 LAB — LIPID PANEL
Cholesterol: 211 mg/dL — ABNORMAL HIGH (ref <200)
HDL: 56 mg/dL (ref >=40)
LDL Cholesterol (Calc): 141 mg/dL (calc) — ABNORMAL HIGH
Non-HDL Cholesterol (Calc): 155 mg/dL (calc) — ABNORMAL HIGH (ref <130)
Total CHOL/HDL Ratio: 3.8 (calc) (ref <5.0)
Triglycerides: 52 mg/dL (ref <150)

## 2019-12-25 ENCOUNTER — Ambulatory Visit (INDEPENDENT_AMBULATORY_CARE_PROVIDER_SITE_OTHER): Payer: Commercial Managed Care - PPO

## 2019-12-25 ENCOUNTER — Other Ambulatory Visit: Payer: Self-pay | Admitting: Gastroenterology

## 2019-12-25 DIAGNOSIS — Z1159 Encounter for screening for other viral diseases: Secondary | ICD-10-CM

## 2019-12-25 LAB — SARS CORONAVIRUS 2 (TAT 6-24 HRS): SARS Coronavirus 2: NEGATIVE

## 2019-12-30 ENCOUNTER — Ambulatory Visit (AMBULATORY_SURGERY_CENTER): Payer: Commercial Managed Care - PPO | Admitting: Gastroenterology

## 2019-12-30 ENCOUNTER — Encounter: Payer: Self-pay | Admitting: Gastroenterology

## 2019-12-30 ENCOUNTER — Other Ambulatory Visit: Payer: Self-pay

## 2019-12-30 VITALS — BP 101/69 | HR 59 | Temp 97.1°F | Resp 10 | Ht 70.5 in | Wt 183.0 lb

## 2019-12-30 DIAGNOSIS — Z1211 Encounter for screening for malignant neoplasm of colon: Secondary | ICD-10-CM

## 2019-12-30 MED ORDER — SODIUM CHLORIDE 0.9 % IV SOLN: 500.0000 mL | Freq: Once | INTRAVENOUS | Status: DC

## 2019-12-30 NOTE — Progress Notes (Signed)
Pt's states no medical or surgical changes since previsit or office visit.  HC - vitals 

## 2019-12-30 NOTE — Op Note (Signed)
[facility] Patient Name: Howard Hicks Procedure Date: 12/30/2019 12:03 PM MRN: [id number] Endoscopist: Sherilyn Cooter L. Myrtie Neither , MD Age: 59 Referring MD:  Date of Birth: October 16, 1960 Gender: Male Account #: [id number] Procedure:                Colonoscopy Indications:              Screening for colorectal malignant neoplasm (normal                            colonoscopy 09/2009) Medicines:                Monitored Anesthesia Care Procedure:                Pre-Anesthesia Assessment:                           - Prior to the procedure, a History and Physical                            was performed, and patient medications and                            allergies were reviewed. The patient's tolerance of                            previous anesthesia was also reviewed. The risks                            and benefits of the procedure and the sedation                            options and risks were discussed with the patient.                            All questions were answered, and informed consent                            was obtained. Prior Anticoagulants: The patient has                            taken no previous anticoagulant or antiplatelet                            agents. ASA Grade Assessment: I - A normal, healthy                            patient. After reviewing the risks and benefits,                            the patient was deemed in satisfactory condition to                            undergo the procedure.  After obtaining informed consent, the colonoscope                            was passed under direct vision. Throughout the                            procedure, the patient's blood pressure, pulse, and                            oxygen saturations were monitored continuously. The                            Colonoscope was introduced through the anus and                            advanced to the the cecum, identified by                             appendiceal orifice and ileocecal valve. The                            colonoscopy was performed without difficulty. The                            patient tolerated the procedure well. The quality                            of the bowel preparation was excellent. The                            ileocecal valve, appendiceal orifice, and rectum                            were photographed. The bowel preparation used was                            Plenvu. Scope In: 12:21:22 PM Scope Out: 12:34:22 PM Scope Withdrawal Time: 0 hours 9 minutes 17 seconds  Total Procedure Duration: 0 hours 13 minutes 0 seconds  Findings:                 The perianal and digital rectal examinations were                            normal.                           The entire examined colon appeared normal on direct                            and retroflexion views. Complications:            No immediate complications. Estimated Blood Loss:     Estimated blood loss: none. Impression:               - The entire examined colon is normal on direct and  retroflexion views.                           - No specimens collected. Recommendation:           - Patient has a contact number available for                            emergencies. The signs and symptoms of potential                            delayed complications were discussed with the                            patient. Return to normal activities tomorrow.                            Written discharge instructions were provided to the                            patient.                           - Resume previous diet.                           - Continue present medications.                           - Repeat colonoscopy in 10 years for screening                            purposes. Tekla Malachowski L. Myrtie Neither, MD 12/30/2019 12:38:01 PM This report has been signed electronically.

## 2019-12-30 NOTE — Progress Notes (Signed)
Report given to PACU, vss 

## 2019-12-30 NOTE — Patient Instructions (Signed)
When your next colonoscopy should occur in 10 years.    You may resume your previous diet and medication schedule.  Thank you for allowing [place] to care for you today!!!   YOU HAD AN ENDOSCOPIC PROCEDURE TODAY AT THE [facility]:   Refer to the procedure report that was given to you for any specific questions about what was found during the examination.  If the procedure report does not answer your questions, please call your gastroenterologist to clarify.  If you requested that your care partner not be given the details of your procedure findings, then the procedure report has been included in a sealed envelope for you to review at your convenience later.  YOU SHOULD EXPECT: Some feelings of bloating in the abdomen. Passage of more gas than usual.  Walking can help get rid of the air that was put into your GI tract during the procedure and reduce the bloating. If you had a lower endoscopy (such as a colonoscopy or flexible sigmoidoscopy) you may notice spotting of blood in your stool or on the toilet paper. If you underwent a bowel prep for your procedure, you may not have a normal bowel movement for a few days.  Please Note:  You might notice some irritation and congestion in your nose or some drainage.  This is from the oxygen used during your procedure.  There is no need for concern and it should clear up in a day or so.  SYMPTOMS TO REPORT IMMEDIATELY:   Following lower endoscopy (colonoscopy or flexible sigmoidoscopy):  Excessive amounts of blood in the stool  Significant tenderness or worsening of abdominal pains  Swelling of the abdomen that is new, acute  Fever of 100F or higher  For urgent or emergent issues, a gastroenterologist can be reached at any hour by calling [phone]. Do not use MyChart messaging for urgent concerns.    DIET:  We do recommend a small meal at first, but then you may proceed to your regular diet.  Drink plenty of fluids but you should avoid  alcoholic beverages for 24 hours.  ACTIVITY:  You should plan to take it easy for the rest of today and you should NOT DRIVE or use heavy machinery until tomorrow (because of the sedation medicines used during the test).    FOLLOW UP: Our staff will call the number listed on your records 48-72 hours following your procedure to check on you and address any questions or concerns that you may have regarding the information given to you following your procedure. If we do not reach you, we will leave a message.  We will attempt to reach you two times.  During this call, we will ask if you have developed any symptoms of COVID 19. If you develop any symptoms (ie: fever, flu-like symptoms, shortness of breath, cough etc.) before then, please call [phone].  If you test positive for Covid 19 in the 2 weeks post procedure, please call and report this information to [place].    If any biopsies were taken you will be contacted by phone or by letter within the next 1-3 weeks.  Please call us at [phone] if you have not heard about the biopsies in 3 weeks.    SIGNATURES/CONFIDENTIALITY: You and/or your care partner have signed paperwork which will be entered into your electronic medical record.  These signatures attest to the fact that that the information above on your After Visit Summary has been reviewed and is understood.  Full responsibility of the confidentiality of this discharge information lies with you and/or your care-partner.

## 2020-01-01 ENCOUNTER — Telehealth: Payer: Self-pay

## 2020-01-01 NOTE — Telephone Encounter (Signed)
Left message on 2nd follow up call. 

## 2020-01-01 NOTE — Telephone Encounter (Signed)
Left message on follow up call. 

## 2020-11-11 ENCOUNTER — Other Ambulatory Visit: Payer: Self-pay | Admitting: Family Medicine

## 2020-11-11 ENCOUNTER — Ambulatory Visit (INDEPENDENT_AMBULATORY_CARE_PROVIDER_SITE_OTHER): Payer: Commercial Managed Care - PPO | Admitting: Family Medicine

## 2020-11-11 ENCOUNTER — Encounter: Payer: Self-pay | Admitting: Family Medicine

## 2020-11-11 ENCOUNTER — Other Ambulatory Visit: Payer: Self-pay

## 2020-11-11 VITALS — BP 128/74 | HR 50 | Temp 97.6°F | Ht 70.5 in | Wt 186.5 lb

## 2020-11-11 DIAGNOSIS — Z125 Encounter for screening for malignant neoplasm of prostate: Secondary | ICD-10-CM | POA: Diagnosis not present

## 2020-11-11 DIAGNOSIS — Z0000 Encounter for general adult medical examination without abnormal findings: Secondary | ICD-10-CM

## 2020-11-11 DIAGNOSIS — E785 Hyperlipidemia, unspecified: Secondary | ICD-10-CM

## 2020-11-11 LAB — LIPID PANEL
Cholesterol: 196 mg/dL (ref 0–200)
HDL: 52.4 mg/dL (ref >39.00)
LDL Cholesterol: 128 mg/dL — ABNORMAL HIGH (ref 0–99)
NonHDL: 143.14
Total CHOL/HDL Ratio: 4
Triglycerides: 78 mg/dL (ref 0.0–149.0)
VLDL: 15.6 mg/dL (ref 0.0–40.0)

## 2020-11-11 LAB — CBC
HCT: 36.3 % — ABNORMAL LOW (ref 39.0–52.0)
Hemoglobin: 11.4 g/dL — ABNORMAL LOW (ref 13.0–17.0)
MCHC: 31.4 g/dL (ref 30.0–36.0)
MCV: 59.7 fl — ABNORMAL LOW (ref 78.0–100.0)
Platelets: 177 10*3/uL (ref 150.0–400.0)
RBC: 6.08 Mil/uL — ABNORMAL HIGH (ref 4.22–5.81)
RDW: 18.1 % — ABNORMAL HIGH (ref 11.5–15.5)
WBC: 4.9 10*3/uL (ref 4.0–10.5)

## 2020-11-11 LAB — COMPREHENSIVE METABOLIC PANEL
ALT: 22 U/L (ref 0–53)
AST: 19 U/L (ref 0–37)
Albumin: 4.2 g/dL (ref 3.5–5.2)
Alkaline Phosphatase: 42 U/L (ref 39–117)
BUN: 10 mg/dL (ref 6–23)
CO2: 29 mEq/L (ref 19–32)
Calcium: 9.5 mg/dL (ref 8.4–10.5)
Chloride: 103 mEq/L (ref 96–112)
Creatinine, Ser: 0.92 mg/dL (ref 0.40–1.50)
GFR: 89.98 mL/min (ref >60.00)
Glucose, Bld: 90 mg/dL (ref 70–99)
Potassium: 4.3 mEq/L (ref 3.5–5.1)
Sodium: 139 mEq/L (ref 135–145)
Total Bilirubin: 2.1 mg/dL — ABNORMAL HIGH (ref 0.2–1.2)
Total Protein: 6.6 g/dL (ref 6.0–8.3)

## 2020-11-11 LAB — PSA: PSA: 1 ng/mL (ref 0.10–4.00)

## 2020-11-11 NOTE — Progress Notes (Signed)
Chief Complaint  Patient presents with   Annual Exam    [name] is here for a complete physical.   His last physical was >1 year ago.  Current diet: in general, a "fair" diet.  Current exercise: walking Weight trend: stable Fatigue out of ordinary? No. Seat belt? Yes.    Health maintenance Shingrix- No Colonoscopy- Yes Tetanus- Yes HIV- Yes Hep C- Yes   Past Medical History:  Diagnosis Date   Alpha thalassemia trait    Anemia    Chicken pox    H/O removal of thyroglossal duct cyst 1975   Hyperlipidemia    on meds      Past Surgical History:  Procedure Laterality Date   THYROID CYST EXCISION  1975   WISDOM TOOTH EXTRACTION  2017    Medications  Current Outpatient Medications on File Prior to Visit  Medication Sig Dispense Refill   Omega-3 Fatty Acids (FISH OIL PO) Take by mouth daily.      Allergies No Known Allergies  Family History Family History  Problem Relation Age of Onset   Hyperlipidemia Father    Dementia Father    Thyroid disease Father    Colon polyps Father 26   Anemia Mother    Osteoporosis Mother    Dementia Paternal Grandmother    Diabetes Maternal Grandmother    Crohn's disease Paternal Uncle    Anemia Brother    Healthy Sister        x2   Colon cancer Neg Hx    Esophageal cancer Neg Hx    Rectal cancer Neg Hx    Stomach cancer Neg Hx     Review of Systems: Constitutional:  no fevers Eye:  no recent significant change in vision Ear/Nose/Mouth/Throat:  Ears:  no hearing loss Nose/Mouth/Throat:  no complaints of nasal congestion, no sore throat Cardiovascular:  no chest pain Respiratory:  no shortness of breath Gastrointestinal:  no change in bowel habits GU:  Male: negative for dysuria, frequency Musculoskeletal/Extremities:  no joint pain Integumentary (Skin/Breast):  no abnormal skin lesions reported Neurologic:  no headaches Endocrine: No unexpected weight changes Hematologic/Lymphatic:  no abnormal  bleeding  Exam BP 128/74   Pulse (!) 50   Temp 97.6 F (36.4 C) (Oral)   Ht 5' 10.5" (1.791 m)   Wt 186 lb 8 oz (84.6 kg)   SpO2 98%   BMI 26.38 kg/m  General:  well developed, well nourished, in no apparent distress Skin:  no significant moles, warts, or growths Head:  no masses, lesions, or tenderness Eyes:  pupils equal and round, sclera anicteric without injection Ears:  canals without lesions, TMs shiny without retraction, no obvious effusion, no erythema Nose:  nares patent, septum midline, mucosa normal Throat/Pharynx:  lips and gingiva without lesion; tongue and uvula midline; non-inflamed pharynx; no exudates or postnasal drainage Neck: neck supple without adenopathy, thyromegaly, or masses Cardiac: RRR, no bruits, no LE edema Lungs:  clear to auscultation, breath sounds equal bilaterally, no respiratory distress Abdomen: BS+, soft, non-tender, non-distended, no masses or organomegaly noted Rectal: Deferred Musculoskeletal:  symmetrical muscle groups noted without atrophy or deformity Neuro:  gait normal; deep tendon reflexes normal and symmetric Psych: well oriented with normal range of affect and appropriate judgment/insight  Assessment and Plan  Well adult exam - Plan: CBC, Comprehensive metabolic panel, Lipid panel  Screening PSA (prostate specific antigen) - Plan: PSA   Well 60 y.o. male. Counseled on diet and exercise. Counseled on risks and  benefits of prostate cancer screening with PSA. The patient agrees to undergo testing. Immunizations, labs, and further orders as above. If cholesterol is intermediate, will order CACS. Discussed OOP cost with him. Declines shingrix. He will let me know if he changes his mind.  Follow up in 1 yr or prn. The patient voiced understanding and agreement to the plan.  Jilda Roche [place], DO 11/11/20 7:13 AM

## 2020-11-11 NOTE — Patient Instructions (Addendum)
Give [place] 2-3 business days to get the results of your labs back.   Keep the diet clean and stay active.  Let me know if you change your mind about the singles vaccine or have questions.  If the cholesterol is "intermediate" again, I will order the CT of the heart.   Let us know if you need anything.

## 2020-12-28 ENCOUNTER — Ambulatory Visit (HOSPITAL_BASED_OUTPATIENT_CLINIC_OR_DEPARTMENT_OTHER)
Admission: RE | Admit: 2020-12-28 | Discharge: 2020-12-28 | Disposition: A | Discharge status: Home / Self Care | Payer: Commercial Managed Care - PPO | Source: Ambulatory Visit | Attending: Family Medicine | Admitting: Family Medicine

## 2020-12-28 ENCOUNTER — Other Ambulatory Visit: Payer: Self-pay

## 2020-12-28 DIAGNOSIS — Z136 Encounter for screening for cardiovascular disorders: Secondary | ICD-10-CM | POA: Insufficient documentation

## 2021-11-13 ENCOUNTER — Encounter: Payer: Commercial Managed Care - PPO | Admitting: Family Medicine

## 2021-11-22 ENCOUNTER — Ambulatory Visit (INDEPENDENT_AMBULATORY_CARE_PROVIDER_SITE_OTHER): Payer: Managed Care, Other (non HMO) | Admitting: Family Medicine

## 2021-11-22 ENCOUNTER — Encounter: Payer: Self-pay | Admitting: Family Medicine

## 2021-11-22 VITALS — BP 122/80 | HR 59 | Temp 98.2°F | Ht 70.0 in | Wt 178.0 lb

## 2021-11-22 DIAGNOSIS — Z0000 Encounter for general adult medical examination without abnormal findings: Secondary | ICD-10-CM

## 2021-11-22 DIAGNOSIS — Z125 Encounter for screening for malignant neoplasm of prostate: Secondary | ICD-10-CM

## 2021-11-22 LAB — LIPID PANEL
Cholesterol: 209 mg/dL — ABNORMAL HIGH (ref 0–200)
HDL: 56.3 mg/dL (ref >39.00)
LDL Cholesterol: 144 mg/dL — ABNORMAL HIGH (ref 0–99)
NonHDL: 152.74
Total CHOL/HDL Ratio: 4
Triglycerides: 42 mg/dL (ref 0.0–149.0)
VLDL: 8.4 mg/dL (ref 0.0–40.0)

## 2021-11-22 LAB — CBC
HCT: 38.7 % — ABNORMAL LOW (ref 39.0–52.0)
Hemoglobin: 12.1 g/dL — ABNORMAL LOW (ref 13.0–17.0)
MCHC: 31.4 g/dL (ref 30.0–36.0)
MCV: 58.7 fl — ABNORMAL LOW (ref 78.0–100.0)
Platelets: 175 10*3/uL (ref 150.0–400.0)
RBC: 6.58 Mil/uL — ABNORMAL HIGH (ref 4.22–5.81)
RDW: 17.8 % — ABNORMAL HIGH (ref 11.5–15.5)
WBC: 4.1 10*3/uL (ref 4.0–10.5)

## 2021-11-22 LAB — PSA: PSA: 0.99 ng/mL (ref 0.10–4.00)

## 2021-11-22 LAB — COMPREHENSIVE METABOLIC PANEL
ALT: 30 U/L (ref 0–53)
AST: 22 U/L (ref 0–37)
Albumin: 4.4 g/dL (ref 3.5–5.2)
Alkaline Phosphatase: 47 U/L (ref 39–117)
BUN: 14 mg/dL (ref 6–23)
CO2: 29 mEq/L (ref 19–32)
Calcium: 9.6 mg/dL (ref 8.4–10.5)
Chloride: 100 mEq/L (ref 96–112)
Creatinine, Ser: 1.06 mg/dL (ref 0.40–1.50)
GFR: 75.36 mL/min (ref >60.00)
Glucose, Bld: 89 mg/dL (ref 70–99)
Potassium: 4.1 mEq/L (ref 3.5–5.1)
Sodium: 137 mEq/L (ref 135–145)
Total Bilirubin: 2.3 mg/dL — ABNORMAL HIGH (ref 0.2–1.2)
Total Protein: 7.1 g/dL (ref 6.0–8.3)

## 2021-11-22 NOTE — Patient Instructions (Addendum)
Give [place] 2-3 business days to get the results of your labs back.   Keep the diet clean and stay active.  Please get me a copy of your advanced directive form at your convenience.   I recommend getting the flu shot in mid October. This suggestion would change if the CDC comes out with a different recommendation.   Keep an eye on the skin lesion on your arm. If it changes over the next few months, let me know and we can either take a biopsy of it or send you to dermatology.   Let us know if you need anything.

## 2021-11-22 NOTE — Progress Notes (Signed)
Chief Complaint  Patient presents with   Annual Exam    [name] is here for a complete physical.   His last physical was >1 year ago.  Current diet: in general, diet could be better.  Current exercise: qigong, walking Weight trend: intentionally losing Fatigue out of ordinary? No. Seat belt? Yes.   Advanced directive? Yes  Health maintenance Shingrix- Declines Colonoscopy- Yes Tetanus- Yes HIV- Yes Hep C- Yes   Past Medical History:  Diagnosis Date   Alpha thalassemia trait    Anemia    Chicken pox    H/O removal of thyroglossal duct cyst 1975   Hyperlipidemia    on meds      Past Surgical History:  Procedure Laterality Date   THYROID CYST EXCISION  1975   WISDOM TOOTH EXTRACTION  2017    Medications  Current Outpatient Medications on File Prior to Visit  Medication Sig Dispense Refill   Omega-3 Fatty Acids (FISH OIL PO) Take by mouth daily.     Allergies No Known Allergies  Family History Family History  Problem Relation Age of Onset   Hyperlipidemia Father    Dementia Father    Thyroid disease Father    Colon polyps Father 71   Anemia Mother    Osteoporosis Mother    Dementia Paternal Grandmother    Diabetes Maternal Grandmother    Crohn's disease Paternal Uncle    Anemia Brother    Healthy Sister        x2   Colon cancer Neg Hx    Esophageal cancer Neg Hx    Rectal cancer Neg Hx    Stomach cancer Neg Hx     Review of Systems: Constitutional:  no fevers Eye:  no recent significant change in vision Ear/Nose/Mouth/Throat:  Ears:  no hearing loss Nose/Mouth/Throat:  no complaints of nasal congestion, no sore throat Cardiovascular:  no chest pain Respiratory:  no shortness of breath Gastrointestinal:  no change in bowel habits GU:  Male: negative for dysuria, frequency Musculoskeletal/Extremities:  no joint pain Integumentary (Skin/Breast): skin lesion on L forearm; otherwise no abnormal skin lesions  reported Neurologic:  no headaches Endocrine: No unexpected weight changes Hematologic/Lymphatic:  no abnormal bleeding  Exam Pulse (!) 59   Temp 98.2 F (36.8 C) (Oral)   Ht 5\' 10"  (1.778 m)   Wt 178 lb (80.7 kg)   SpO2 98%   BMI 25.54 kg/m  General:  well developed, well nourished, in no apparent distress Skin: See below; otherwise no significant moles, warts, or growths Head:  no masses, lesions, or tenderness Eyes:  pupils equal and round, sclera anicteric without injection Ears:  canals without lesions, TMs shiny without retraction, no obvious effusion, no erythema Nose:  nares patent, septum midline, mucosa normal Throat/Pharynx:  lips and gingiva without lesion; tongue and uvula midline; non-inflamed pharynx; no exudates or postnasal drainage Neck: neck supple without adenopathy, thyromegaly, or masses Cardiac: RRR, no bruits, no LE edema Lungs:  clear to auscultation, breath sounds equal bilaterally, no respiratory distress Abdomen: BS+, soft, non-tender, non-distended, no masses or organomegaly noted Rectal: Deferred Musculoskeletal:  symmetrical muscle groups noted without atrophy or deformity Neuro:  gait normal; deep tendon reflexes normal and symmetric Psych: well oriented with normal range of affect and appropriate judgment/insight  L forearm  Assessment and Plan  Well adult exam - Plan: CBC, Comprehensive metabolic panel, Lipid panel  Screening PSA (prostate specific antigen) - Plan: PSA   Well 61 y.o. male.  Counseled on diet and exercise. Counseled on risks and benefits of prostate cancer screening with PSA. The patient agrees to undergo testing. Advanced directive form requested today.  Offered biopsy, referral to derm, watchful waiting for skin lesion which appears to be a pigmented verruca. He will watch for changes. If there are, he will let us know.  Immunizations, labs, and further orders as above. Follow up in 1 yr or prn. The patient voiced  understanding and agreement to the plan.  Jilda Roche [place], DO 11/22/21 7:28 AM

## 2022-11-26 ENCOUNTER — Other Ambulatory Visit: Payer: Self-pay | Admitting: Family Medicine

## 2022-11-26 ENCOUNTER — Ambulatory Visit: Payer: Managed Care, Other (non HMO) | Admitting: Family Medicine

## 2022-11-26 ENCOUNTER — Encounter: Payer: Self-pay | Admitting: Family Medicine

## 2022-11-26 VITALS — BP 120/72 | HR 67 | Temp 98.8°F | Ht 70.0 in | Wt 179.2 lb

## 2022-11-26 DIAGNOSIS — R972 Elevated prostate specific antigen [PSA]: Secondary | ICD-10-CM

## 2022-11-26 DIAGNOSIS — Z0000 Encounter for general adult medical examination without abnormal findings: Secondary | ICD-10-CM

## 2022-11-26 DIAGNOSIS — N529 Male erectile dysfunction, unspecified: Secondary | ICD-10-CM | POA: Insufficient documentation

## 2022-11-26 DIAGNOSIS — Z13220 Encounter for screening for lipoid disorders: Secondary | ICD-10-CM | POA: Diagnosis not present

## 2022-11-26 DIAGNOSIS — Z0001 Encounter for general adult medical examination with abnormal findings: Secondary | ICD-10-CM

## 2022-11-26 DIAGNOSIS — Z125 Encounter for screening for malignant neoplasm of prostate: Secondary | ICD-10-CM | POA: Diagnosis not present

## 2022-11-26 LAB — COMPREHENSIVE METABOLIC PANEL
ALT: 22 U/L (ref 0–53)
AST: 21 U/L (ref 0–37)
Albumin: 4 g/dL (ref 3.5–5.2)
Alkaline Phosphatase: 40 U/L (ref 39–117)
BUN: 11 mg/dL (ref 6–23)
CO2: 29 mEq/L (ref 19–32)
Calcium: 9.1 mg/dL (ref 8.4–10.5)
Chloride: 103 mEq/L (ref 96–112)
Creatinine, Ser: 1.02 mg/dL (ref 0.40–1.50)
GFR: 78.37 mL/min (ref >60.00)
Glucose, Bld: 88 mg/dL (ref 70–99)
Potassium: 4 mEq/L (ref 3.5–5.1)
Sodium: 139 mEq/L (ref 135–145)
Total Bilirubin: 1.8 mg/dL — ABNORMAL HIGH (ref 0.2–1.2)
Total Protein: 6.5 g/dL (ref 6.0–8.3)

## 2022-11-26 LAB — CBC
HCT: 38.2 % — ABNORMAL LOW (ref 39.0–52.0)
Hemoglobin: 11.8 g/dL — ABNORMAL LOW (ref 13.0–17.0)
MCHC: 30.9 g/dL (ref 30.0–36.0)
MCV: 58.4 fl — ABNORMAL LOW (ref 78.0–100.0)
Platelets: 182 10*3/uL (ref 150.0–400.0)
RBC: 6.51 Mil/uL — ABNORMAL HIGH (ref 4.22–5.81)
RDW: 18.5 % — ABNORMAL HIGH (ref 11.5–15.5)
WBC: 5 10*3/uL (ref 4.0–10.5)

## 2022-11-26 LAB — PSA: PSA: 1.73 ng/mL (ref 0.10–4.00)

## 2022-11-26 LAB — LIPID PANEL
Cholesterol: 173 mg/dL (ref 0–200)
HDL: 48.9 mg/dL (ref >39.00)
LDL Cholesterol: 109 mg/dL — ABNORMAL HIGH (ref 0–99)
NonHDL: 123.81
Total CHOL/HDL Ratio: 4
Triglycerides: 75 mg/dL (ref 0.0–149.0)
VLDL: 15 mg/dL (ref 0.0–40.0)

## 2022-11-26 MED ORDER — SILDENAFIL CITRATE 100 MG PO TABS
50.0000 mg | ORAL_TABLET | Freq: Every day | ORAL | 0 refills | Status: DC | PRN
Start: 2022-11-26 — End: 2023-03-05

## 2022-11-26 NOTE — Progress Notes (Signed)
Chief Complaint  Patient presents with   Annual Exam    Well Male GEOF Hicks is here for a complete physical.   His last physical was >1 year ago.  Current diet: in general, diet is improving.  Current exercise: walking, cycling, calisthenics Weight trend: stable Fatigue out of ordinary? No. Seat belt? Yes.   Advanced directive? Yes  Health maintenance Shingrix- No Colonoscopy- Yes Tetanus- Yes HIV- Yes Hep C- Yes  ED Pt is seeing someone, having issues with ED. Interested in a medication. Has never been on anything before. No issues with libido.    Past Medical History:  Diagnosis Date   Alpha thalassemia trait    Anemia    Chicken pox    H/O removal of thyroglossal duct cyst 1975   Hyperlipidemia    on meds     Past Surgical History:  Procedure Laterality Date   THYROID CYST EXCISION  1975   WISDOM TOOTH EXTRACTION  2017   Medications  Current Outpatient Medications on File Prior to Visit  Medication Sig Dispense Refill   Omega-3 Fatty Acids (FISH OIL PO) Take by mouth daily.     Allergies No Known Allergies  Family History Family History  Problem Relation Age of Onset   Hyperlipidemia Father    Dementia Father    Thyroid disease Father    Colon polyps Father 95   Anemia Mother    Osteoporosis Mother    Dementia Paternal Grandmother    Diabetes Maternal Grandmother    Crohn's disease Paternal Uncle    Anemia Brother    Healthy Sister        x2   Colon cancer Neg Hx    Esophageal cancer Neg Hx    Rectal cancer Neg Hx    Stomach cancer Neg Hx     Review of Systems: Constitutional:  no fevers Eye:  no recent significant change in vision Ear/Nose/Mouth/Throat:  Ears:  no hearing loss Nose/Mouth/Throat:  no complaints of nasal congestion, no sore throat Cardiovascular:  no chest pain Respiratory:  no shortness of breath Gastrointestinal:  no change in bowel habits GU:  Male: negative for dysuria, frequency Musculoskeletal/Extremities:   no joint pain Integumentary (Skin/Breast):  no abnormal skin lesions reported Neurologic:  no headaches Endocrine: No unexpected weight changes Hematologic/Lymphatic:  no abnormal bleeding  Exam BP 120/72 (BP Location: Left Arm, Patient Position: Sitting, Cuff Size: Normal)   Pulse 67   Temp 98.8 F (37.1 C) (Oral)   Ht 5\' 10"  (1.778 m)   Wt 179 lb 4 oz (81.3 kg)   SpO2 99%   BMI 25.72 kg/m  General:  well developed, well nourished, in no apparent distress Skin:  no significant moles, warts, or growths Head:  no masses, lesions, or tenderness Eyes:  pupils equal and round, sclera anicteric without injection Ears:  canals without lesions, TMs shiny without retraction, no obvious effusion, no erythema Nose:  nares patent, mucosa normal Throat/Pharynx:  lips and gingiva without lesion; tongue and uvula midline; non-inflamed pharynx; no exudates or postnasal drainage Neck: neck supple without adenopathy, thyromegaly, or masses Cardiac: RRR, no bruits, no LE edema Lungs:  clear to auscultation, breath sounds equal bilaterally, no respiratory distress Abdomen: BS+, soft, non-tender, non-distended, no masses or organomegaly noted Rectal: Deferred Musculoskeletal:  symmetrical muscle groups noted without atrophy or deformity Neuro:  gait normal; deep tendon reflexes normal and symmetric Psych: well oriented with normal range of affect and appropriate judgment/insight  Assessment and Plan  Well  adult exam - Plan: CBC, Comprehensive metabolic panel, Lipid panel  Screening for prostate cancer - Plan: PSA  Erectile dysfunction, unspecified erectile dysfunction type - Plan: sildenafil (VIAGRA) 100 MG tablet   Well 62 y.o. male. Counseled on diet and exercise. Counseled on risks and benefits of prostate cancer screening with PSA. The patient agrees to undergo testing. Hx of alpha thal.  Shingrix rec'd.  ED: Will trial sildenafil 50-100 mg/d prn. GoodRx rec'd.  Based on previous CACS,  rec'd statin. Politely declined despite warnings that it can prevent heart attack, stroke and death.  Advanced directive form provided today.  Immunizations, labs, and further orders as above. Follow up in 1 yr or prn. The patient voiced understanding and agreement to the plan.  Howard Roche [place], DO 11/26/22 7:30 AM

## 2022-11-26 NOTE — Patient Instructions (Addendum)
Give [place] 2-3 business days to get the results of your labs back.   Keep the diet clean and stay active.  GoodRx can help with pricing for the medication.   Please get me a copy of your advanced directive form at your convenience.   I recommend getting the flu shot in mid October. This suggestion would change if the CDC comes out with a different recommendation.   The Shingrix vaccine (for shingles) is a 2 shot series spaced 2-6 months apart. It can make people feel low energy, achy and almost like they have the flu for 48 hours after injection. 1/5 people can have nausea and/or vomiting. Please plan accordingly when deciding on when to get this shot. Call our office for a nurse visit appointment to get this. The second shot of the series is less severe regarding the side effects, but it still lasts 48 hours.   Let me know if you change your mind about going on a cholesterol-lowering medication.   Let us know if you need anything.

## 2023-01-07 ENCOUNTER — Other Ambulatory Visit: Payer: Self-pay | Admitting: Family Medicine

## 2023-01-07 ENCOUNTER — Other Ambulatory Visit (INDEPENDENT_AMBULATORY_CARE_PROVIDER_SITE_OTHER): Payer: Managed Care, Other (non HMO)

## 2023-01-07 DIAGNOSIS — R972 Elevated prostate specific antigen [PSA]: Secondary | ICD-10-CM

## 2023-01-07 LAB — PSA: PSA: 1.58 ng/mL (ref 0.10–4.00)

## 2023-01-21 ENCOUNTER — Ambulatory Visit: Payer: Managed Care, Other (non HMO) | Admitting: Urology

## 2023-01-21 ENCOUNTER — Encounter: Payer: Self-pay | Admitting: Urology

## 2023-01-21 VITALS — BP 159/76 | HR 52 | Ht 70.0 in | Wt 178.0 lb

## 2023-01-21 DIAGNOSIS — Z8042 Family history of malignant neoplasm of prostate: Secondary | ICD-10-CM | POA: Insufficient documentation

## 2023-01-21 DIAGNOSIS — R972 Elevated prostate specific antigen [PSA]: Secondary | ICD-10-CM | POA: Insufficient documentation

## 2023-01-21 NOTE — Addendum Note (Signed)
Addended by: Lizbeth Bark on: 01/21/2023 10:17 AM   Modules accepted: Orders

## 2023-01-21 NOTE — Progress Notes (Signed)
Assessment: 1. Rising PSA level   2. Family history of prostate cancer in father     Plan: I personally reviewed the patient's chart including provider notes, and lab results. I discussed the potential implications of a rising PSA including prostate cancer with the patient today.  Given the recent crease in his PSA, I would recommend a repeat PSA in approximately 6 weeks. He is in agreement with the plan. PSA in 6 weeks - will call with results.  Chief Complaint:  Chief Complaint  Patient presents with   Rising PSA level     History of Present Illness:  Howard Hicks is a 62 y.o. male who is seen in consultation from [place], DO for evaluation of rising PSA. PSA results: 7/20 1.0 7/21 1.0 7/22 1.0 8/23 1.73 9/24 1.58  No history of UTIs or prostatitis. He has a family history of prostate cancer with his father. No significant lower urinary tract symptoms other than some frequency and decreased stream.  No dysuria or gross hematuria. IPSS = 6 today.  Past Medical History:  Past Medical History:  Diagnosis Date   Alpha thalassemia trait    Anemia    Chicken pox    H/O removal of thyroglossal duct cyst 1975   Hyperlipidemia    on meds    Past Surgical History:  Past Surgical History:  Procedure Laterality Date   THYROID CYST EXCISION  1975   WISDOM TOOTH EXTRACTION  2017    Allergies:  No Known Allergies  Family History:  Family History  Problem Relation Age of Onset   Hyperlipidemia Father    Dementia Father    Thyroid disease Father    Colon polyps Father 5   Anemia Mother    Osteoporosis Mother    Dementia Paternal Grandmother    Diabetes Maternal Grandmother    Crohn's disease Paternal Uncle    Anemia Brother    Healthy Sister        x2   Colon cancer Neg Hx    Esophageal cancer Neg Hx    Rectal cancer Neg Hx    Stomach cancer Neg Hx     Social History:  Social History   Tobacco Use   Smoking status: Former     Types: Cigars    Quit date: 1978    Years since quitting: 46.7   Smokeless tobacco: Never   Tobacco comments:    Cigars Only Socially.  Vaping Use   Vaping status: Never Used  Substance Use Topics   Alcohol use: Yes    Alcohol/week: 0.0 standard drinks of alcohol    Comment: occassionally   Drug use: No    Review of symptoms:  Constitutional:  Negative for unexplained weight loss, night sweats, fever, chills ENT:  Negative for nose bleeds, sinus pain, painful swallowing CV:  Negative for chest pain, shortness of breath, exercise intolerance, palpitations, loss of consciousness Resp:  Negative for cough, wheezing, shortness of breath GI:  Negative for nausea, vomiting, diarrhea, bloody stools GU:  Positives noted in HPI; otherwise negative for gross hematuria, dysuria, urinary incontinence Neuro:  Negative for seizures, poor balance, limb weakness, slurred speech Psych:  Negative for lack of energy, depression, anxiety Endocrine:  Negative for polydipsia, polyuria, symptoms of hypoglycemia (dizziness, hunger, sweating) Hematologic:  Negative for anemia, purpura, petechia, prolonged or excessive bleeding, use of anticoagulants  Allergic:  Negative for difficulty breathing or choking as a result of exposure to anything; no shellfish allergy; no allergic  response (rash/itch) to materials, foods  Physical exam: BP (!) 159/76   Pulse (!) 52   Ht 5\' 10"  (1.778 m)   Wt 178 lb (80.7 kg)   BMI 25.54 kg/m  GENERAL APPEARANCE:  Well appearing, well developed, well nourished, NAD HEENT: Atraumatic, Normocephalic, oropharynx clear. NECK: Supple without lymphadenopathy or thyromegaly. LUNGS: Clear to auscultation bilaterally. HEART: Regular Rate and Rhythm without murmurs, gallops, or rubs. ABDOMEN: Soft, non-tender, No Masses. EXTREMITIES: Moves all extremities well.  Without clubbing, cyanosis, or edema. NEUROLOGIC:  Alert and oriented x 3, normal gait, CN II-XII grossly intact.  MENTAL  STATUS:  Appropriate. BACK:  Non-tender to palpation.  No CVAT SKIN:  Warm, dry and intact.   GU: Penis:  circumcised Meatus: Normal Scrotum: normal, no masses Testis: normal without masses bilateral Prostate: 40 g, NT, no nodules Rectum: Normal tone,  no masses or tenderness   Results: No specimen provided

## 2023-03-04 ENCOUNTER — Other Ambulatory Visit: Payer: Managed Care, Other (non HMO)

## 2023-03-04 DIAGNOSIS — R972 Elevated prostate specific antigen [PSA]: Secondary | ICD-10-CM

## 2023-03-05 ENCOUNTER — Other Ambulatory Visit: Payer: Self-pay | Admitting: Family Medicine

## 2023-03-05 ENCOUNTER — Encounter: Payer: Self-pay | Admitting: Urology

## 2023-03-05 DIAGNOSIS — N529 Male erectile dysfunction, unspecified: Secondary | ICD-10-CM

## 2023-03-05 LAB — PSA: Prostate Specific Ag, Serum: 1.5 ng/mL (ref 0.0–4.0)

## 2023-03-06 MED ORDER — SILDENAFIL CITRATE 100 MG PO TABS
50.0000 mg | ORAL_TABLET | Freq: Every day | ORAL | 0 refills | Status: DC | PRN
Start: 2023-03-06 — End: 2023-06-19

## 2023-04-15 IMAGING — CT CT CARDIAC CORONARY ARTERY CALCIUM SCORE
2 series · 15 of 20 positions shown, 17 images · non-contrast
Comparison: None.
COMPARISON: None.

Addendum:
EXAM:
OVER-READ INTERPRETATION  CT CHEST

The following report is an over-read performed by radiologist Dr.
Leover Lamadrid [[email or web address]] on 12/28/2020. This over-read
does not include interpretation of cardiac or coronary anatomy or
pathology. The calcium score interpretation by the cardiologist is
attached.
CLINICAL DATA: 60M for cardiovascular disease risk stratification
Coronary Calcium Score
TECHNIQUE: A gated, non-contrast computed tomography scan of the heart was
performed using 3mm slice thickness. Axial images were analyzed on a
dedicated workstation. Calcium scoring of the coronary arteries was
performed using the Agatston method.

[Series 2: casc 3.0 i36f 2 bestdiast 69 % · axial · 0.39mm/px · z∈[+1034,+1130]mm · 8 of 42 slices shown, 10 images]
[im 5/42  vessel]
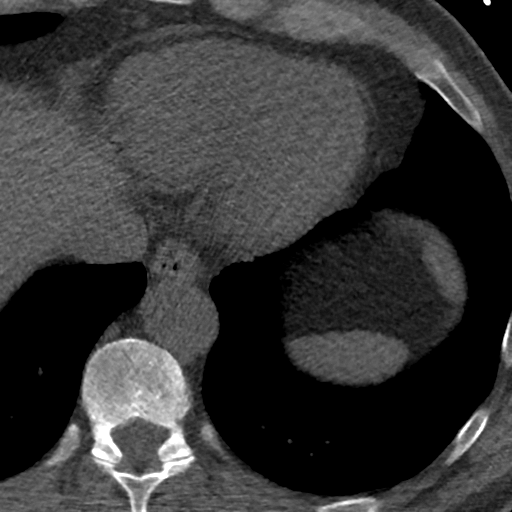
[im 5/42  lung]
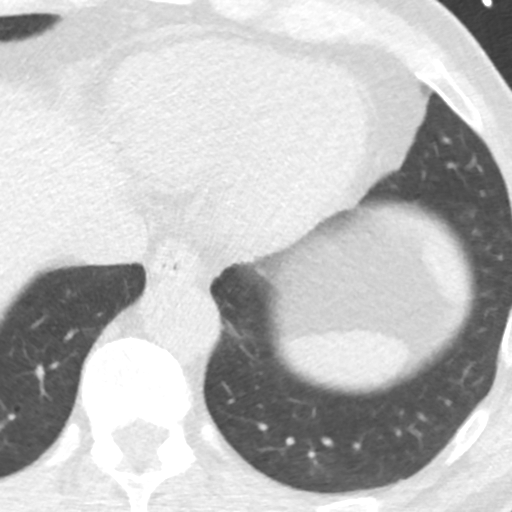
[im 10/42  vessel]
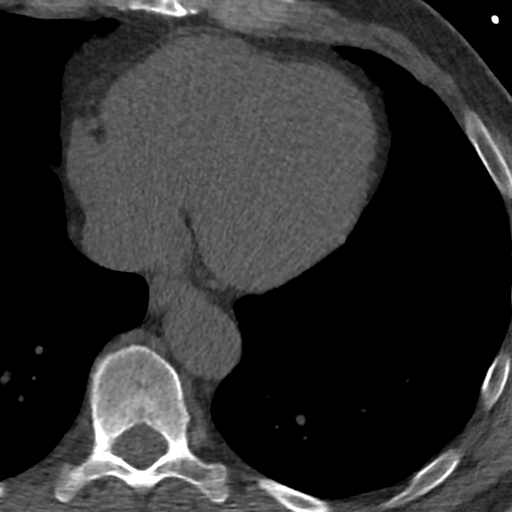
[im 14/42  vessel]
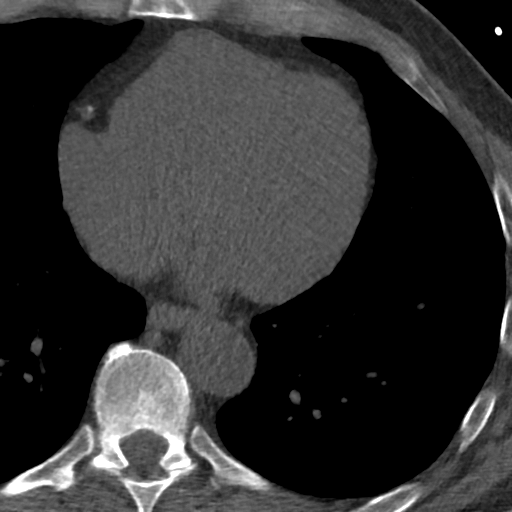
[im 19/42  vessel]
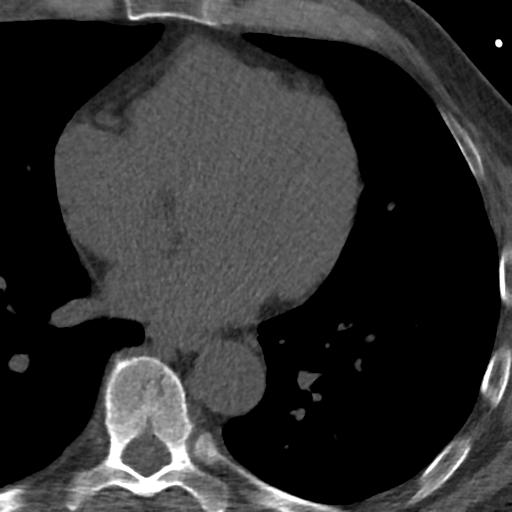
[im 23/42  vessel]
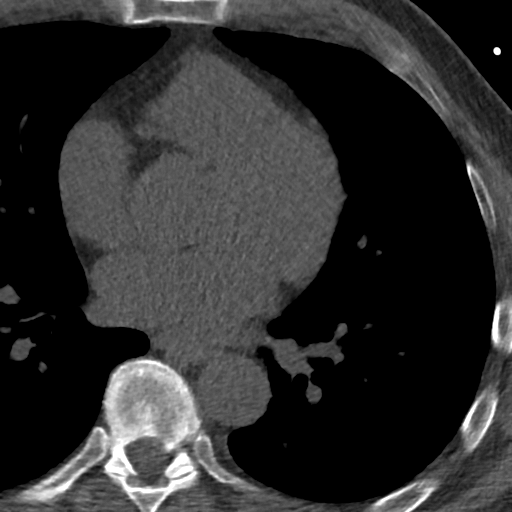
[im 23/42  lung]
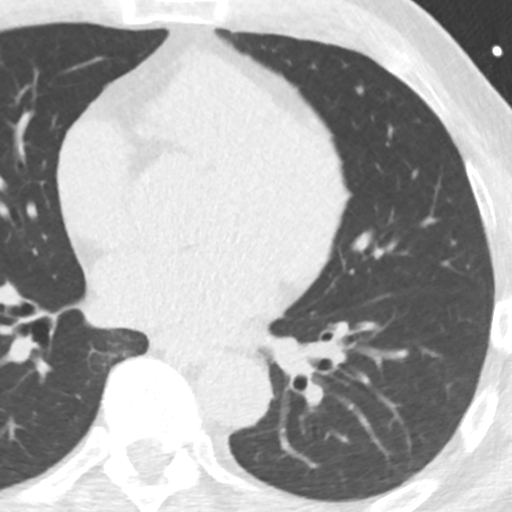
[im 28/42  vessel]
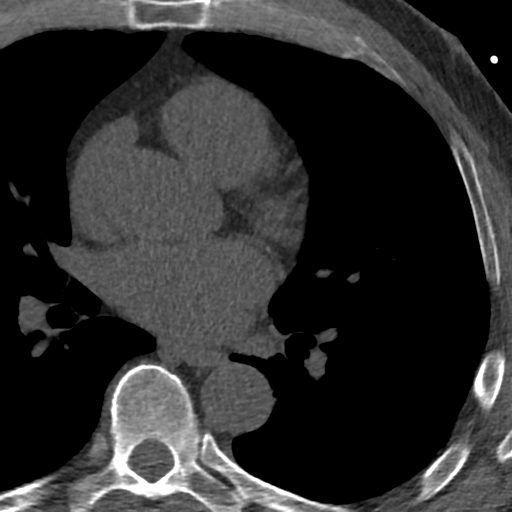
[im 32/42  vessel]
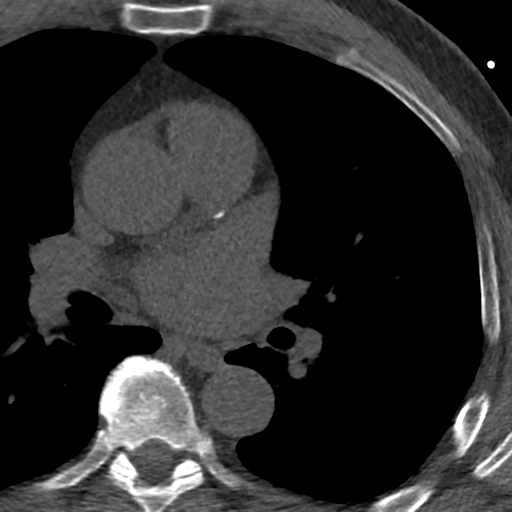
[im 37/42  vessel]
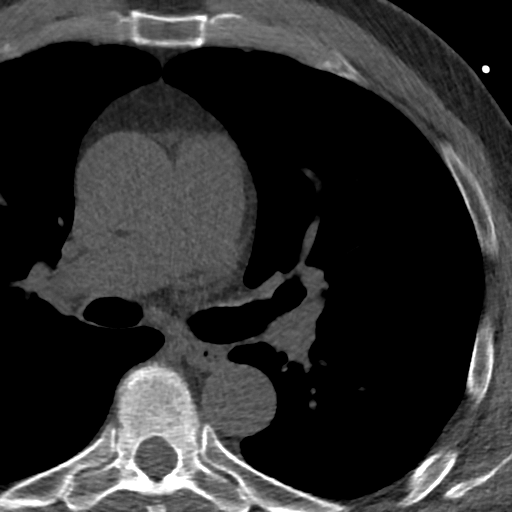

[Series 6: lung st 68 % · axial · 0.72mm/px · z∈[+1034,+1116]mm · 7 of 42 slices shown]
[im 5/42  lung]
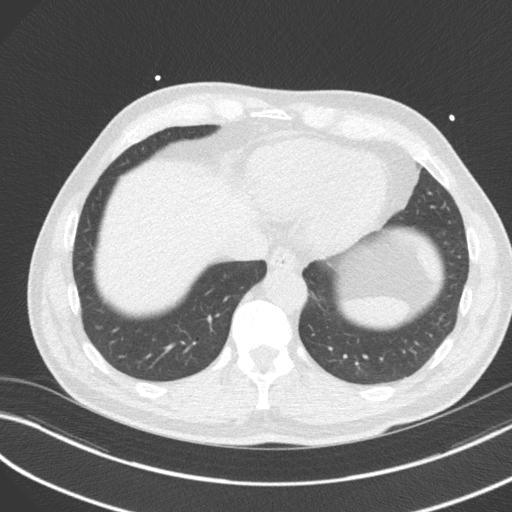
[im 10/42  lung]
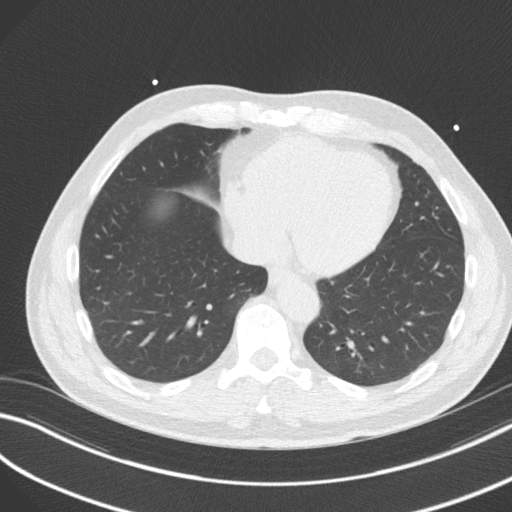
[im 14/42  lung]
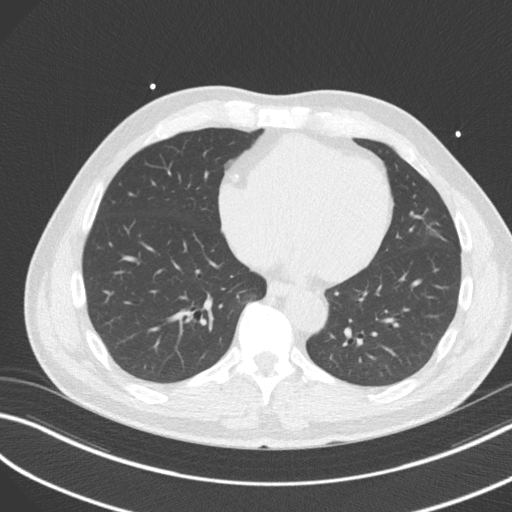
[im 19/42  lung]
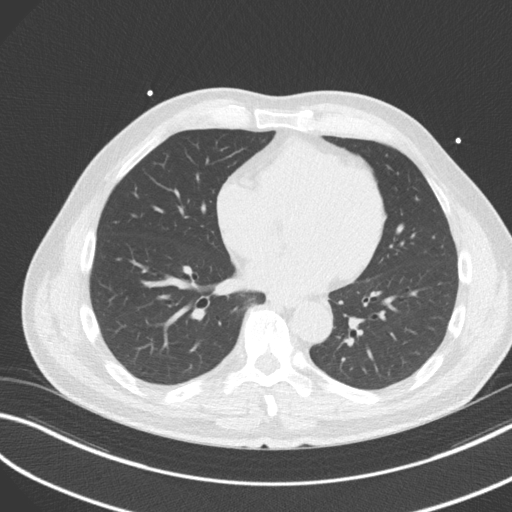
[im 23/42  lung]
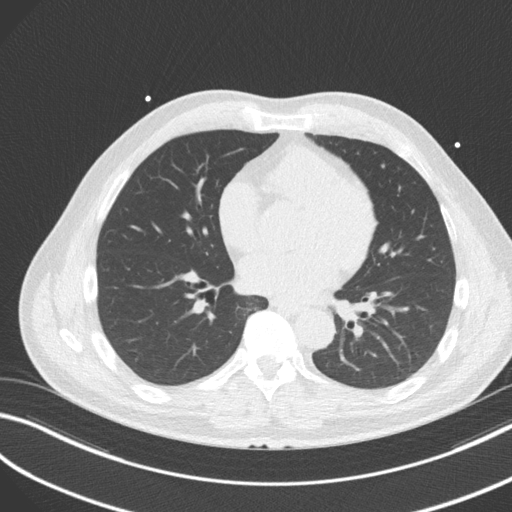
[im 28/42  lung]
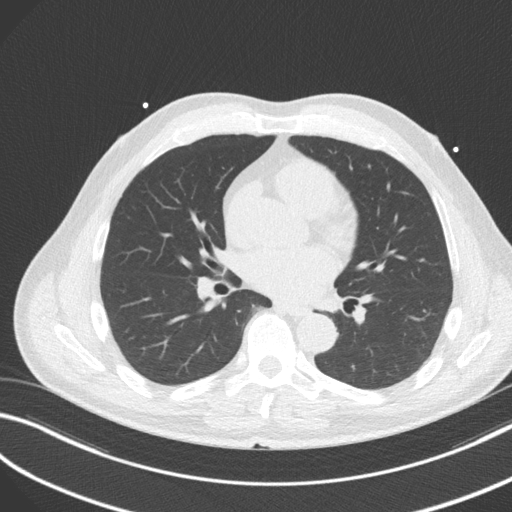
[im 32/42  lung]
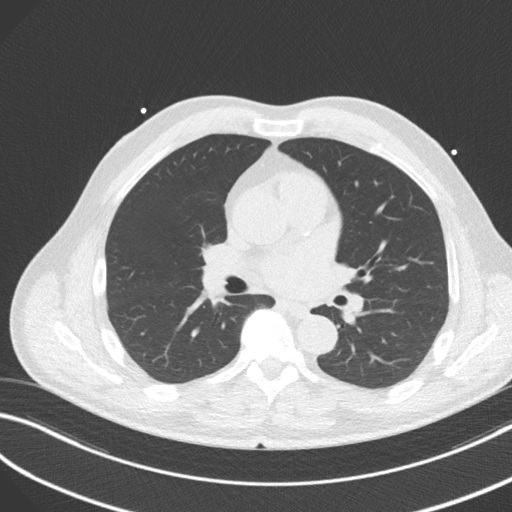

[15 of 20 positions shown; findings below may reference images not displayed]

FINDINGS: Vascular: Normal aortic caliber.  Tortuous thoracic aorta.

Mediastinum/Nodes: No imaged thoracic adenopathy.

Lungs/Pleura: No pleural fluid.  Clear imaged lungs.

Upper Abdomen: Normal imaged portions of the liver, spleen, stomach.

Musculoskeletal: No acute osseous abnormality.
IMPRESSION: No acute findings in the imaged extracardiac chest.
FINDINGS: Coronary arteries: Normal origins.

Coronary Calcium Score:

Left main: 0

Left anterior descending artery:

Left circumflex artery:

Right coronary artery:

Total:

Percentile: 55th

Pericardium: Normal.

Ascending Aorta: Normal caliber.  No calcification

Non-cardiac: See separate report from [REDACTED].
IMPRESSION: Coronary calcium score of 53.6. This was 55th percentile for age-,
race-, and sex-matched controls.



If CAC=0, it is reasonable to withhold statin therapy and reassess
in 5 to 10 years, as long as higher risk conditions are absent
(diabetes mellitus, family history of premature CHD in first degree
relatives (males <55 years; females <65 years), cigarette smoking,
or LDL >=190 mg/dL).

If CAC is 1 to 99, it is reasonable to initiate statin therapy for
patients >=55 years of age.

If CAC is >=100 or >=75th percentile, it is reasonable to initiate
statin therapy at any age.

Cardiology referral should be considered for patients with CAC
scores >=400 or >=75th percentile.

*1815 AHA/ACC/AACVPR/AAPA/ABC/MERRIWEATHER/TIGER/CHIEN/[place]/REONA/SHARMAINE/CHUKZY
Guideline on the Management of Blood Cholesterol: A Report of the
American College of Cardiology/American Heart Association Task Force
on Clinical Practice Guidelines. J Am Coll Cardiol.
9762;73(24):0317-0983.

*** End of Addendum ***
EXAM:
OVER-READ INTERPRETATION  CT CHEST

The following report is an over-read performed by radiologist Dr.
Leover Lamadrid [[email or web address]] on 12/28/2020. This over-read
does not include interpretation of cardiac or coronary anatomy or
pathology. The calcium score interpretation by the cardiologist is
attached.
FINDINGS: Vascular: Normal aortic caliber.  Tortuous thoracic aorta.

Mediastinum/Nodes: No imaged thoracic adenopathy.

Lungs/Pleura: No pleural fluid.  Clear imaged lungs.

Upper Abdomen: Normal imaged portions of the liver, spleen, stomach.

Musculoskeletal: No acute osseous abnormality.
IMPRESSION: No acute findings in the imaged extracardiac chest.

## 2023-06-19 ENCOUNTER — Other Ambulatory Visit: Payer: Self-pay | Admitting: Family Medicine

## 2023-06-19 DIAGNOSIS — N529 Male erectile dysfunction, unspecified: Secondary | ICD-10-CM

## 2023-06-19 MED ORDER — SILDENAFIL CITRATE 100 MG PO TABS
50.0000 mg | ORAL_TABLET | Freq: Every day | ORAL | 0 refills | Status: AC | PRN
Start: 2023-06-19 — End: ?

## 2023-07-31 ENCOUNTER — Ambulatory Visit: Admitting: Family Medicine

## 2023-07-31 ENCOUNTER — Encounter: Payer: Self-pay | Admitting: Family Medicine

## 2023-07-31 VITALS — BP 118/76 | HR 60 | Temp 98.1°F | Resp 18 | Ht 70.0 in | Wt 188.2 lb

## 2023-07-31 DIAGNOSIS — R972 Elevated prostate specific antigen [PSA]: Secondary | ICD-10-CM

## 2023-07-31 DIAGNOSIS — R6882 Decreased libido: Secondary | ICD-10-CM

## 2023-07-31 LAB — TESTOSTERONE: Testosterone: 481.95 ng/dL (ref 300.00–890.00)

## 2023-07-31 LAB — PSA: PSA: 1.48 ng/mL (ref 0.10–4.00)

## 2023-07-31 NOTE — Progress Notes (Signed)
 CC: Low libido  Subjective: Patient is a 63 y.o. male here for discussion of testosterone.  Steadily getting worse, more noticeable over past 2-3 mo. he is having decreased libido, energy, and difficulty with erections.  He would like to have his testosterone checked.  He does have a history of thalassemia which does not feel similar with his fatigue.  He has never been on any testosterone supplementation before.  Trey Paula denies any radiation treatment to his testicles or head/neck area.  Past Medical History:  Diagnosis Date   Alpha thalassemia trait    Anemia    Chicken pox    H/O removal of thyroglossal duct cyst 1975   Hyperlipidemia    on meds    Objective: BP 118/76 (BP Location: Left Arm, Cuff Size: Normal)   Pulse 60   Temp 98.1 F (36.7 C)   Resp 18   Ht 5\' 10"  (1.778 m)   Wt 188 lb 3.2 oz (85.4 kg)   SpO2 100%   BMI 27.00 kg/m  General: Awake, appears stated age Heart: RRR Lungs: No accessory muscle use Psych: Age appropriate judgment and insight, normal affect and mood  Assessment and Plan: Decreased libido - Plan: Testosterone  Rising PSA level - Plan: PSA  Check testosterone.  Discussed rechecking in 2 weeks as a confirmatory test.  We discussed options and the [facility] should his testing be normal and he wish to pursue treatment further. Increased PSA velocity, will recheck today to ensure stability. The patient voiced understanding and agreement to the plan.  Jilda Roche [place], DO 07/31/23  8:04 AM

## 2023-07-31 NOTE — Patient Instructions (Signed)
 Give [place] 2-3 business days to get the results of your labs back.  Let us know if you need anything.

## 2023-12-02 ENCOUNTER — Ambulatory Visit (INDEPENDENT_AMBULATORY_CARE_PROVIDER_SITE_OTHER): Payer: Managed Care, Other (non HMO) | Admitting: Family Medicine

## 2023-12-02 ENCOUNTER — Encounter: Payer: Self-pay | Admitting: Family Medicine

## 2023-12-02 ENCOUNTER — Ambulatory Visit: Payer: Self-pay | Admitting: Family Medicine

## 2023-12-02 VITALS — BP 124/76 | HR 56 | Temp 98.1°F | Resp 12 | Ht 70.0 in | Wt 183.0 lb

## 2023-12-02 DIAGNOSIS — Z0000 Encounter for general adult medical examination without abnormal findings: Secondary | ICD-10-CM | POA: Diagnosis not present

## 2023-12-02 DIAGNOSIS — Z125 Encounter for screening for malignant neoplasm of prostate: Secondary | ICD-10-CM

## 2023-12-02 DIAGNOSIS — Z2821 Immunization not carried out because of patient refusal: Secondary | ICD-10-CM

## 2023-12-02 LAB — COMPREHENSIVE METABOLIC PANEL WITH GFR
ALT: 18 U/L (ref 0–53)
AST: 17 U/L (ref 0–37)
Albumin: 4.1 g/dL (ref 3.5–5.2)
Alkaline Phosphatase: 41 U/L (ref 39–117)
BUN: 11 mg/dL (ref 6–23)
CO2: 29 meq/L (ref 19–32)
Calcium: 8.9 mg/dL (ref 8.4–10.5)
Chloride: 103 meq/L (ref 96–112)
Creatinine, Ser: 0.91 mg/dL (ref 0.40–1.50)
GFR: 89.23 mL/min (ref >60.00)
Glucose, Bld: 97 mg/dL (ref 70–99)
Potassium: 4.1 meq/L (ref 3.5–5.1)
Sodium: 139 meq/L (ref 135–145)
Total Bilirubin: 1.6 mg/dL — ABNORMAL HIGH (ref 0.2–1.2)
Total Protein: 6.2 g/dL (ref 6.0–8.3)

## 2023-12-02 LAB — CBC
HCT: 36.9 % — ABNORMAL LOW (ref 39.0–52.0)
Hemoglobin: 11.5 g/dL — ABNORMAL LOW (ref 13.0–17.0)
MCHC: 31.1 g/dL (ref 30.0–36.0)
MCV: 57.8 fl — ABNORMAL LOW (ref 78.0–100.0)
Platelets: 157 K/uL (ref 150.0–400.0)
RBC: 6.38 Mil/uL — ABNORMAL HIGH (ref 4.22–5.81)
RDW: 18.1 % — ABNORMAL HIGH (ref 11.5–15.5)
WBC: 3.9 K/uL — ABNORMAL LOW (ref 4.0–10.5)

## 2023-12-02 LAB — LIPID PANEL
Cholesterol: 201 mg/dL — ABNORMAL HIGH (ref 0–200)
HDL: 54.8 mg/dL (ref >39.00)
LDL Cholesterol: 136 mg/dL — ABNORMAL HIGH (ref 0–99)
NonHDL: 146.45
Total CHOL/HDL Ratio: 4
Triglycerides: 50 mg/dL (ref 0.0–149.0)
VLDL: 10 mg/dL (ref 0.0–40.0)

## 2023-12-02 LAB — PSA: PSA: 1.37 ng/mL (ref 0.10–4.00)

## 2023-12-02 NOTE — Patient Instructions (Addendum)
 Give us  2-3 business days to get the results of your labs back.   Keep the diet clean and stay active.  Please get me a copy of your advanced directive form at your convenience.   Please consider getting the pneumonia vaccine.   I recommend getting the flu shot in mid October. This suggestion would change if the CDC comes out with a different recommendation.   The Shingrix vaccine (for shingles) is a 2 shot series spaced 2-6 months apart. It can make people feel low energy, achy and almost like they have the flu for 48 hours after injection. 1/5 people can have nausea and/or vomiting. Please plan accordingly when deciding on when to get this shot. Call our office for a nurse visit appointment to get this. The second shot of the series is less severe regarding the side effects, but it still lasts 48 hours.   Let us  know if you need anything.

## 2023-12-02 NOTE — Progress Notes (Signed)
 CC: Well visit  [name] is here for a complete physical.   His last physical was >1 year ago.  Current diet: in general, a healthy diet.  Current exercise: walking Weight trend: stable Fatigue out of ordinary? No. Seat belt? Yes.   Advanced directive? Yes  Health maintenance Shingrix- No Colonoscopy- Yes Tetanus- Yes HIV- Yes Hep C- Yes   Past Medical History:  Diagnosis Date   Alpha thalassemia trait    H/O removal of thyroglossal duct cyst 1975   Hyperlipidemia    on meds     Past Surgical History:  Procedure Laterality Date   THYROID  CYST EXCISION  1975   WISDOM TOOTH EXTRACTION  2017    Medications  Current Outpatient Medications on File Prior to Visit  Medication Sig Dispense Refill   Omega-3 Fatty Acids (FISH OIL PO) Take by mouth daily.     sildenafil  (VIAGRA ) 100 MG tablet Take 0.5-1 tablets (50-100 mg total) by mouth daily as needed for erectile dysfunction. 10 tablet 0    Allergies No Known Allergies  Family History Family History  Problem Relation Age of Onset   Hyperlipidemia Father    Dementia Father    Thyroid  disease Father    Colon polyps Father 63   Anemia Mother    Osteoporosis Mother    Dementia Paternal Grandmother    Diabetes Maternal Grandmother    Crohn's disease Paternal Uncle    Anemia Brother    Healthy Sister        x2   Colon cancer Neg Hx    Esophageal cancer Neg Hx    Rectal cancer Neg Hx    Stomach cancer Neg Hx     Review of Systems: Constitutional:  no fevers Eye:  no recent significant change in vision Ear/Nose/Mouth/Throat:  Ears:  no hearing loss Nose/Mouth/Throat:  no complaints of nasal congestion, no sore throat Cardiovascular:  no chest pain Respiratory:  no shortness of breath Gastrointestinal:  no change in bowel habits GU:  Male: negative for dysuria, frequency Musculoskeletal/Extremities:  no joint pain Integumentary (Skin/Breast):  no abnormal skin lesions  reported Neurologic:  no headaches Endocrine: No unexpected weight changes Hematologic/Lymphatic:  no abnormal bleeding  Exam BP 124/76 (Patient Position: Sitting, Cuff Size: Normal)   Pulse (!) 56   Temp 98.1 F (36.7 C)   Resp 12   Ht 5' 10 (1.778 m)   Wt 183 lb (83 kg)   SpO2 98%   BMI 26.26 kg/m  General:  well developed, well nourished, in no apparent distress Skin:  no significant moles, warts, or growths Head:  no masses, lesions, or tenderness Eyes:  pupils equal and round, sclera anicteric without injection Ears:  canals without lesions, TMs shiny without retraction, no obvious effusion, no erythema Nose:  nares patent, mucosa normal Throat/Pharynx:  lips and gingiva without lesion; tongue and uvula midline; non-inflamed pharynx; no exudates or postnasal drainage Neck: neck supple without adenopathy, thyromegaly, or masses Cardiac: RRR, no bruits, no LE edema Lungs:  clear to auscultation, breath sounds equal bilaterally, no respiratory distress Abdomen: BS+, soft, non-tender, non-distended, no masses or organomegaly noted Rectal: Deferred Musculoskeletal:  symmetrical muscle groups noted without atrophy or deformity Neuro:  gait normal; deep tendon reflexes normal and symmetric Psych: well oriented with normal range of affect and appropriate judgment/insight  Assessment and Plan  Well adult exam - Plan: CBC, Comprehensive metabolic panel with GFR, Lipid panel  Screening for prostate cancer - Plan: PSA  Refused pneumococcal vaccination   Well 63 y.o. male. Counseled on diet and exercise. Advanced directive form requested today. Shingrix and PCV20 rec'd. Discussed the benefit of these. Politely declined.  Counseled on risks and benefits of prostate cancer screening with PSA. The patient agrees to undergo testing. Immunizations, labs, and further orders as above. Follow up in 1 yr. The patient voiced understanding and agreement to the plan.  Mabel Mt  [place], DO 12/02/23 7:19 AM

## 2024-12-02 ENCOUNTER — Encounter: Admitting: Family Medicine
